# Patient Record
Sex: Male | Born: 1945
Health system: Southern US, Community
[De-identification: ages and names within clinical notes are randomized; demographics above are authoritative.]

## PROBLEM LIST (undated history)

## (undated) DIAGNOSIS — E785 Hyperlipidemia, unspecified: Secondary | ICD-10-CM

## (undated) DIAGNOSIS — M1990 Unspecified osteoarthritis, unspecified site: Secondary | ICD-10-CM

## (undated) DIAGNOSIS — I10 Essential (primary) hypertension: Secondary | ICD-10-CM

## (undated) HISTORY — DX: Hyperlipidemia, unspecified: E78.5

## (undated) HISTORY — DX: Unspecified osteoarthritis, unspecified site: M19.90

## (undated) HISTORY — PX: FRACTURE SURGERY: SHX138

## (undated) HISTORY — PX: BACK SURGERY: SHX140

## (undated) HISTORY — DX: Essential (primary) hypertension: I10

---

## 1997-11-06 ENCOUNTER — Emergency Department (HOSPITAL_COMMUNITY): Admission: EM | Admit: 1997-11-06 | Discharge: 1997-11-06 | Payer: Self-pay | Admitting: Emergency Medicine

## 1999-12-07 ENCOUNTER — Encounter: Payer: Self-pay | Admitting: Internal Medicine

## 1999-12-07 ENCOUNTER — Ambulatory Visit (HOSPITAL_COMMUNITY): Admission: RE | Admit: 1999-12-07 | Discharge: 1999-12-07 | Payer: Self-pay | Admitting: Internal Medicine

## 2000-12-10 ENCOUNTER — Encounter: Payer: Self-pay | Admitting: Neurosurgery

## 2000-12-10 ENCOUNTER — Encounter: Admission: RE | Admit: 2000-12-10 | Discharge: 2000-12-10 | Payer: Self-pay | Admitting: Neurosurgery

## 2000-12-24 ENCOUNTER — Encounter: Admission: RE | Admit: 2000-12-24 | Discharge: 2000-12-24 | Payer: Self-pay | Admitting: Neurosurgery

## 2000-12-24 ENCOUNTER — Encounter: Payer: Self-pay | Admitting: Neurosurgery

## 2001-02-27 ENCOUNTER — Ambulatory Visit (HOSPITAL_COMMUNITY): Admission: RE | Admit: 2001-02-27 | Discharge: 2001-02-28 | Payer: Self-pay | Admitting: Neurosurgery

## 2001-02-27 ENCOUNTER — Encounter: Payer: Self-pay | Admitting: Neurosurgery

## 2002-10-06 ENCOUNTER — Ambulatory Visit (HOSPITAL_BASED_OUTPATIENT_CLINIC_OR_DEPARTMENT_OTHER): Admission: RE | Admit: 2002-10-06 | Discharge: 2002-10-06 | Payer: Self-pay | Admitting: Orthopedic Surgery

## 2002-12-13 ENCOUNTER — Ambulatory Visit (HOSPITAL_BASED_OUTPATIENT_CLINIC_OR_DEPARTMENT_OTHER): Admission: RE | Admit: 2002-12-13 | Discharge: 2002-12-13 | Payer: Self-pay | Admitting: Orthopedic Surgery

## 2004-08-02 ENCOUNTER — Ambulatory Visit: Payer: Self-pay | Admitting: Internal Medicine

## 2004-08-09 ENCOUNTER — Ambulatory Visit: Payer: Self-pay | Admitting: Internal Medicine

## 2005-08-08 ENCOUNTER — Ambulatory Visit: Payer: Self-pay | Admitting: Internal Medicine

## 2005-08-13 ENCOUNTER — Ambulatory Visit: Payer: Self-pay | Admitting: Internal Medicine

## 2006-07-24 ENCOUNTER — Ambulatory Visit: Payer: Self-pay | Admitting: Internal Medicine

## 2006-08-22 ENCOUNTER — Ambulatory Visit: Payer: Self-pay | Admitting: Internal Medicine

## 2006-08-22 LAB — CONVERTED CEMR LAB
Albumin: 4.1 g/dL (ref 3.5–5.2)
Alkaline Phosphatase: 47 units/L (ref 39–117)
BUN: 12 mg/dL (ref 6–23)
Basophils Absolute: 0 10*3/uL (ref 0.0–0.1)
Bilirubin Urine: NEGATIVE
Creatinine, Ser: 1.1 mg/dL (ref 0.4–1.5)
Eosinophils Absolute: 0.2 10*3/uL (ref 0.0–0.6)
GFR calc Af Amer: 88 mL/min
GFR calc non Af Amer: 73 mL/min
HDL: 51.6 mg/dL (ref >39.0)
Hemoglobin, Urine: NEGATIVE
Hemoglobin: 12.9 g/dL — ABNORMAL LOW (ref 13.0–17.0)
LDL Cholesterol: 100 mg/dL — ABNORMAL HIGH (ref 0–99)
Leukocytes, UA: NEGATIVE
MCHC: 34.8 g/dL (ref 30.0–36.0)
MCV: 93.6 fL (ref 78.0–100.0)
Monocytes Absolute: 0.3 10*3/uL (ref 0.2–0.7)
Monocytes Relative: 10 % (ref 3.0–11.0)
Neutro Abs: 1.2 10*3/uL — ABNORMAL LOW (ref 1.4–7.7)
PSA: 0.85 ng/mL (ref 0.10–4.00)
Potassium: 4 meq/L (ref 3.5–5.1)
RDW: 12.7 % (ref 11.5–14.6)
TSH: 0.82 microintl units/mL (ref 0.35–5.50)
Triglycerides: 87 mg/dL (ref 0–149)
VLDL: 17 mg/dL (ref 0–40)
pH: 6 (ref 5.0–8.0)

## 2006-09-04 ENCOUNTER — Ambulatory Visit: Payer: Self-pay | Admitting: Internal Medicine

## 2006-09-26 ENCOUNTER — Ambulatory Visit (HOSPITAL_BASED_OUTPATIENT_CLINIC_OR_DEPARTMENT_OTHER): Admission: RE | Admit: 2006-09-26 | Discharge: 2006-09-26 | Payer: Self-pay | Admitting: Orthopedic Surgery

## 2007-01-16 ENCOUNTER — Ambulatory Visit (HOSPITAL_BASED_OUTPATIENT_CLINIC_OR_DEPARTMENT_OTHER): Admission: RE | Admit: 2007-01-16 | Discharge: 2007-01-16 | Payer: Self-pay | Admitting: Internal Medicine

## 2007-01-28 ENCOUNTER — Ambulatory Visit: Payer: Self-pay | Admitting: Pulmonary Disease

## 2007-03-18 ENCOUNTER — Ambulatory Visit: Payer: Self-pay | Admitting: Internal Medicine

## 2007-03-18 ENCOUNTER — Encounter: Payer: Self-pay | Admitting: Internal Medicine

## 2007-03-19 DIAGNOSIS — G4733 Obstructive sleep apnea (adult) (pediatric): Secondary | ICD-10-CM

## 2007-07-22 ENCOUNTER — Telehealth: Payer: Self-pay | Admitting: Internal Medicine

## 2007-07-23 ENCOUNTER — Telehealth: Payer: Self-pay | Admitting: Internal Medicine

## 2007-07-31 ENCOUNTER — Encounter: Payer: Self-pay | Admitting: Internal Medicine

## 2007-07-31 DIAGNOSIS — E785 Hyperlipidemia, unspecified: Secondary | ICD-10-CM | POA: Insufficient documentation

## 2007-07-31 DIAGNOSIS — Z87442 Personal history of urinary calculi: Secondary | ICD-10-CM

## 2007-07-31 DIAGNOSIS — R209 Unspecified disturbances of skin sensation: Secondary | ICD-10-CM | POA: Insufficient documentation

## 2007-08-04 ENCOUNTER — Ambulatory Visit: Payer: Self-pay | Admitting: Pulmonary Disease

## 2007-08-06 ENCOUNTER — Encounter: Payer: Self-pay | Admitting: Pulmonary Disease

## 2007-08-06 ENCOUNTER — Ambulatory Visit (HOSPITAL_BASED_OUTPATIENT_CLINIC_OR_DEPARTMENT_OTHER): Admission: RE | Admit: 2007-08-06 | Discharge: 2007-08-06 | Payer: Self-pay | Admitting: Pulmonary Disease

## 2007-08-12 ENCOUNTER — Ambulatory Visit: Payer: Self-pay | Admitting: Pulmonary Disease

## 2007-08-17 ENCOUNTER — Encounter: Payer: Self-pay | Admitting: Pulmonary Disease

## 2007-08-31 ENCOUNTER — Ambulatory Visit: Payer: Self-pay | Admitting: Pulmonary Disease

## 2007-09-18 ENCOUNTER — Ambulatory Visit: Payer: Self-pay | Admitting: Internal Medicine

## 2007-09-20 LAB — CONVERTED CEMR LAB
Albumin: 4.2 g/dL (ref 3.5–5.2)
Alkaline Phosphatase: 39 units/L (ref 39–117)
BUN: 13 mg/dL (ref 6–23)
Bilirubin Urine: NEGATIVE
Calcium: 9.6 mg/dL (ref 8.4–10.5)
Cholesterol: 157 mg/dL (ref 0–200)
Eosinophils Absolute: 0.1 10*3/uL (ref 0.0–0.7)
Eosinophils Relative: 3.7 % (ref 0.0–5.0)
GFR calc Af Amer: 61 mL/min
GFR calc non Af Amer: 51 mL/min
Glucose, Bld: 79 mg/dL (ref 70–99)
HCT: 40.3 % (ref 39.0–52.0)
HDL: 51.7 mg/dL (ref >39.0)
Hemoglobin, Urine: NEGATIVE
Hemoglobin: 13.6 g/dL (ref 13.0–17.0)
MCV: 97.6 fL (ref 78.0–100.0)
Monocytes Absolute: 0.3 10*3/uL (ref 0.1–1.0)
Monocytes Relative: 11.2 % (ref 3.0–12.0)
Neutro Abs: 1.5 10*3/uL (ref 1.4–7.7)
PSA: 0.62 ng/mL (ref 0.10–4.00)
Platelets: 139 10*3/uL — ABNORMAL LOW (ref 150–400)
Potassium: 4.2 meq/L (ref 3.5–5.1)
RDW: 12.5 % (ref 11.5–14.6)
TSH: 0.78 microintl units/mL (ref 0.35–5.50)
Total Protein, Urine: NEGATIVE mg/dL
Total Protein: 6.7 g/dL (ref 6.0–8.3)
Triglycerides: 93 mg/dL (ref 0–149)
Urine Glucose: NEGATIVE mg/dL
VLDL: 19 mg/dL (ref 0–40)
WBC: 3.1 10*3/uL — ABNORMAL LOW (ref 4.5–10.5)
pH: 6 (ref 5.0–8.0)

## 2007-09-25 ENCOUNTER — Ambulatory Visit: Payer: Self-pay | Admitting: Internal Medicine

## 2007-09-25 DIAGNOSIS — M159 Polyosteoarthritis, unspecified: Secondary | ICD-10-CM | POA: Insufficient documentation

## 2007-12-23 ENCOUNTER — Ambulatory Visit: Payer: Self-pay | Admitting: Internal Medicine

## 2007-12-23 ENCOUNTER — Telehealth: Payer: Self-pay | Admitting: Internal Medicine

## 2007-12-23 ENCOUNTER — Ambulatory Visit: Payer: Self-pay | Admitting: Cardiology

## 2007-12-23 DIAGNOSIS — R519 Headache, unspecified: Secondary | ICD-10-CM | POA: Insufficient documentation

## 2007-12-23 DIAGNOSIS — R51 Headache: Secondary | ICD-10-CM

## 2007-12-24 ENCOUNTER — Telehealth: Payer: Self-pay | Admitting: Internal Medicine

## 2008-03-07 ENCOUNTER — Ambulatory Visit: Payer: Self-pay | Admitting: Pulmonary Disease

## 2008-04-27 ENCOUNTER — Encounter: Payer: Self-pay | Admitting: Internal Medicine

## 2008-08-02 ENCOUNTER — Ambulatory Visit: Payer: Self-pay | Admitting: Internal Medicine

## 2008-08-02 LAB — CONVERTED CEMR LAB
Albumin: 4.1 g/dL (ref 3.5–5.2)
Alkaline Phosphatase: 42 units/L (ref 39–117)
Basophils Absolute: 0 10*3/uL (ref 0.0–0.1)
Chloride: 105 meq/L (ref 96–112)
Cholesterol: 182 mg/dL (ref 0–200)
Direct LDL: 89.3 mg/dL
Eosinophils Absolute: 0.1 10*3/uL (ref 0.0–0.7)
GFR calc non Af Amer: 79.16 mL/min (ref >60)
HDL: 53.9 mg/dL (ref >39.00)
Hemoglobin, Urine: NEGATIVE
Hemoglobin: 14.2 g/dL (ref 13.0–17.0)
Lymphocytes Relative: 38.9 % (ref 12.0–46.0)
MCHC: 34 g/dL (ref 30.0–36.0)
Monocytes Relative: 9.7 % (ref 3.0–12.0)
Neutro Abs: 1.7 10*3/uL (ref 1.4–7.7)
Neutrophils Relative %: 46.8 % (ref 43.0–77.0)
Nitrite: NEGATIVE
Platelets: 136 10*3/uL — ABNORMAL LOW (ref 150.0–400.0)
RDW: 12 % (ref 11.5–14.6)
Sodium: 142 meq/L (ref 135–145)
Specific Gravity, Urine: 1.025 (ref 1.000–1.030)
Total Bilirubin: 0.9 mg/dL (ref 0.3–1.2)
Total CHOL/HDL Ratio: 3
Total Protein, Urine: NEGATIVE mg/dL
Triglycerides: 239 mg/dL — ABNORMAL HIGH (ref 0.0–149.0)
Urobilinogen, UA: 0.2 (ref 0.0–1.0)
VLDL: 47.8 mg/dL — ABNORMAL HIGH (ref 0.0–40.0)

## 2008-08-04 ENCOUNTER — Encounter (INDEPENDENT_AMBULATORY_CARE_PROVIDER_SITE_OTHER): Payer: Self-pay | Admitting: *Deleted

## 2008-08-09 ENCOUNTER — Ambulatory Visit: Payer: Self-pay | Admitting: Internal Medicine

## 2008-08-09 DIAGNOSIS — M23302 Other meniscus derangements, unspecified lateral meniscus, unspecified knee: Secondary | ICD-10-CM

## 2008-08-09 DIAGNOSIS — F528 Other sexual dysfunction not due to a substance or known physiological condition: Secondary | ICD-10-CM

## 2008-08-09 DIAGNOSIS — K649 Unspecified hemorrhoids: Secondary | ICD-10-CM | POA: Insufficient documentation

## 2008-08-09 DIAGNOSIS — S82899A Other fracture of unspecified lower leg, initial encounter for closed fracture: Secondary | ICD-10-CM | POA: Insufficient documentation

## 2008-08-09 LAB — CONVERTED CEMR LAB: Testosterone: 349.24 ng/dL — ABNORMAL LOW (ref 350.00–890.00)

## 2009-03-13 ENCOUNTER — Ambulatory Visit: Payer: Self-pay | Admitting: Internal Medicine

## 2009-08-11 ENCOUNTER — Ambulatory Visit: Payer: Self-pay | Admitting: Internal Medicine

## 2009-08-11 LAB — CONVERTED CEMR LAB
Albumin: 4.3 g/dL (ref 3.5–5.2)
Alkaline Phosphatase: 48 units/L (ref 39–117)
BUN: 11 mg/dL (ref 6–23)
Basophils Relative: 0.2 % (ref 0.0–3.0)
Bilirubin Urine: NEGATIVE
Cholesterol: 147 mg/dL (ref 0–200)
Creatinine, Ser: 1.3 mg/dL (ref 0.4–1.5)
Eosinophils Absolute: 0.1 10*3/uL (ref 0.0–0.7)
GFR calc non Af Amer: 71.93 mL/min (ref >60)
HCT: 41.8 % (ref 39.0–52.0)
Hemoglobin, Urine: NEGATIVE
Hemoglobin: 13.9 g/dL (ref 13.0–17.0)
Lymphs Abs: 1.2 10*3/uL (ref 0.7–4.0)
MCHC: 33.3 g/dL (ref 30.0–36.0)
MCV: 97.2 fL (ref 78.0–100.0)
Monocytes Absolute: 0.6 10*3/uL (ref 0.1–1.0)
Neutro Abs: 1.6 10*3/uL (ref 1.4–7.7)
Nitrite: NEGATIVE
PSA: 2.81 ng/mL (ref 0.10–4.00)
RBC: 4.31 M/uL (ref 4.22–5.81)
TSH: 1.08 microintl units/mL (ref 0.35–5.50)
Total Protein, Urine: NEGATIVE mg/dL
Total Protein: 7.5 g/dL (ref 6.0–8.3)

## 2009-08-18 ENCOUNTER — Ambulatory Visit: Payer: Self-pay | Admitting: Internal Medicine

## 2009-08-18 DIAGNOSIS — M2240 Chondromalacia patellae, unspecified knee: Secondary | ICD-10-CM | POA: Insufficient documentation

## 2009-08-20 ENCOUNTER — Telehealth: Payer: Self-pay | Admitting: Internal Medicine

## 2009-08-21 ENCOUNTER — Ambulatory Visit: Payer: Self-pay | Admitting: Internal Medicine

## 2009-08-21 ENCOUNTER — Telehealth: Payer: Self-pay | Admitting: Internal Medicine

## 2009-09-03 ENCOUNTER — Telehealth: Payer: Self-pay | Admitting: Internal Medicine

## 2010-06-19 NOTE — Progress Notes (Signed)
  Phone Note Outgoing Call   Summary of Call: Due to night sweats please call the patient to return for PA & lateral chest x-ray 780.8  Thanks Initial call taken by: Jacques Navy MD,  August 20, 2009 7:12 PM  Follow-up for Phone Call        lmoam for pt to call back Follow-up by: Ami Bullins CMA,  August 21, 2009 10:20 AM  Additional Follow-up for Phone Call Additional follow up Details #1::        pt to come in this afternoon. I have put in idx Additional Follow-up by: Ami Bullins CMA,  August 21, 2009 1:15 PM

## 2010-06-19 NOTE — Progress Notes (Signed)
  Phone Note Outgoing Call   Reason for Call: Discuss lab or test results Summary of Call: Please call patient: chest xray was normal with no enlaged lymph nodes. If he continues to hve night sweats will need a follow-up appointment Initial call taken by: Jacques Navy MD,  September 03, 2009 10:16 PM  Follow-up for Phone Call        lmoam for pt to call back Follow-up by: Ami Bullins CMA,  September 05, 2009 2:08 PM

## 2010-06-19 NOTE — Assessment & Plan Note (Signed)
Summary: CPX / NWS #   Vital Signs:  Patient profile:   65 year old male Height:      68 inches (172.72 cm) Weight:      189.50 pounds (86.14 kg) O2 Sat:      96 % on Room air Temp:     97.6 degrees F (36.44 degrees C) oral Pulse rate:   85 / minute BP sitting:   150 / 88  (left arm) Cuff size:   regular  Vitals Entered By: Bill Salinas CMA (August 18, 2009 2:46 PM)/ Sydell Axon, SMA  O2 Flow:  Room air CC: Pt here for CPX, per pt, last flu vax 02/17/2009, colonoscopy 09/12/2003, and last eye exam 09/2008 (normal). //Elsie   Primary Care Provider:  [facility]  CC:  Pt here for CPX, per pt, last flu vax 02/17/2009, colonoscopy 09/12/2003, and and last eye exam 09/2008 (normal). //Aneta.  History of Present Illness: Patient presents for annual medical followup.  He is having increasing pain and paresthesia left hip and proximal LE. He has constant pain and low back pain routinely worse with walking.  With standing he will have a dense numbness. He doesn't have much pain at night. He does have pain with movement of the right knee at night. He had been on Naproxen/esomeprazole (Vimovo) for a swollen knee that developed after he had a "pop" in the knee. He did receive a steroid injection which did not help  Having post-traumatic arthritic pain right wrist.   He is having night sweats, drenching night clothes. Previously intermittent  but for the last 2-3 months nightly. No weight loss. No enlarged lymph glands. He did have an exacerbation over the past two weeks during which time he had diarrhea-watery stools without blood. His stools are becoming more formed.   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.9) DYSPHAGIA UNSPECIFIED (ICD-787.20) NEPHROLITHIASIS, HX OF (ICD-V13.01) OBSTRUCTIVE SLEEP APNEA (ICD-327.23)  Past Surgical History: Last updated: 08/09/2008 Bone Graft from Left Hip for wrist repair Repair of Right wrist in 1987 Hemilaminectomy L5-S1 October 2002 by Ashley Akin Matrixectomy by podiatry in 2002 Carpal tunnel release May '08 ([facility]) ORIF left ankle -( [facility])  Family History: Last updated: 08/09/2008 father - 1921: health is good mother - 71: health is good brother 1943-deceased: lung disease related to smoking Neg-colon cancer; DM; CAD/MI M Uncle - prostate cancer  Social History: Last updated: 08/09/2008 HSG,  Service - Scientist, research (life sciences); 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[place]; Insurance account manager Married '81- 3.5 yrs/divorced; Married '00 1 daughter - '74; 3 grandchildren ('96, '04, '10) Midwife - courthouse married to nurse  Risk Factors: Exercise: yes (09/25/2007)  Risk Factors: Smoking Status: never (03/13/2009)  Review of Systems  The patient denies anorexia, fever, weight loss, weight gain, vision loss, hoarseness, chest pain, syncope, dyspnea on exertion, prolonged cough, headaches, hemoptysis, abdominal pain, hematochezia, severe indigestion/heartburn, incontinence, muscle weakness, transient blindness, depression, abnormal bleeding, and angioedema.    Physical Exam  General:  WNWD atheletic appearing AA male in no distress Head:  Normocephalic and atraumatic without obvious abnormalities. No apparent alopecia or balding. Eyes:  vision  grossly intact, pupils equal, pupils round, corneas and lenses clear, no injection, and no optic disk abnormalities.   Ears:  External ear  exam shows no significant lesions or deformities.  Otoscopic examination reveals clear canals, tympanic membranes are intact bilaterally without bulging, retraction, inflammation or discharge. Hearing is grossly normal bilaterally. Nose:  no external deformity and no external erythema.   Mouth:  Oral mucosa and oropharynx without lesions or exudates.  Teeth in good repair. Neck:  supple, full ROM, no thyromegaly, and no carotid bruits.   Chest Wall:  no deformities.   Lungs:  Normal respiratory effort, chest expands symmetrically. Lungs are clear to auscultation, no crackles or wheezes. Heart:  Normal rate and regular rhythm. S1 and S2 normal without gallop, murmur, click, rub or other extra sounds. Abdomen:  soft, non-tender, normal bowel sounds, no masses, no guarding, no hepatomegaly, and no splenomegaly.   Rectal:  No external abnormalities noted. Normal sphincter tone. No rectal masses or tenderness. Prostate:  Prostate gland firm and smooth, no enlargement, nodularity, tenderness, mass, asymmetry or induration. Msk:  marked crepitis right knee. Negative drawer sign. No medial or lateral instability. Decreased crepitis left knee. Tenderness with external rotation of the  left hip. Tender to pressure against the greater trochanter. Pulses:  2+ radial and DP pulses Extremities:  No clubbing, cyanosis, edema, or deformity noted with normal full range of motion of all joints.   Neurologic:  mild decreased sensation dorsal aspect proximal left LE. DTR's 2+ and equal.Nl SLR sitting position.alert & oriented X3, cranial nerves II-XII intact, strength normal in all extremities, and gait normal.   Skin:  turgor normal, color normal, no rashes, no suspicious lesions, no petechiae, no ulcerations, and no edema.   Cervical Nodes:  no anterior cervical adenopathy and no posterior cervical adenopathy.   Inguinal Nodes:  no R inguinal adenopathy and no L inguinal adenopathy.   Psych:  Oriented X3, memory  intact for recent and remote, normally interactive, and good eye contact.     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 Notable crepitis at the patella. Symptoms and exam consistent with chondromalacia right knee  Plan - knee films           NSAIDs           for increasing pain or limitation ortho consult.  His updated medication list for this problem includes:    Vimovo 500-20 Mg Tbec (Naproxen-esomeprazole) ..... One by mouth two times a day with food as needed for knee pain  DG KNEE 2 VIEWS*R* - [id number]   Clinical Data: Pain   RIGHT KNEE - 1-2 VIEW   Comparison: 03/13/2009   Findings: There is a moderate sized joint effusion.  There are changes of chondromalacia at the patellofemoral joint. Weightbearing compartments appear normal.  No fracture or other focal lesion.   IMPRESSION: Joint effusion.  Changes of chondromalacia at the patellofemoral joint.   Read By:  Thomasenia Sales,  M.D.  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  Plan - adjustment of belt if possible.  Problem # 3:  GEN OSTEOARTHROSIS INVOLVING MULTIPLE SITES (ICD-715.09) Right wrist pain is most likely post-traumatic arthritis. 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  Plan - bilateral hip films  otc NSAIDs - GI precautions given.  His updated medication list for this problem includes:    Vimovo 500-20 Mg Tbec (Naproxen-esomeprazole) ..... One by mouth two times a day with food as needed for knee pain  Orders: T-Bilateral Hip w/Pelvis, min 2 views (73520TC) G HIP BILATERAL W/PELVIS - [id number]   Clinical Data: Left hip pain   BILATERAL HIP WITH PELVIS - 4+ VIEW   Comparison: None.   Findings: Frontal view of  the pelvis shows no evidence for fracture.  The joint space in the hips as well preserved and symmetric.  There is some minimal degenerative spurring in each acetabulum.   Prominent bony protuberance is seen in the region of the left anterior inferior iliac spine.  This has two adjacent tiny bone fragments and there is associated irregularity of the superior iliac spine.  These changes are probably related to previous avulsion injury related to the rectus femoris or sartorius.  Less likely considerations would include osteochondroma of the left iliac bone.   SI joints and symphysis pubis are unremarkable.  AP and frog-leg lateral views of the left hip show no evidence for fracture.  AP and frog-leg lateral views of the right hip are also without evidence of fracture.   IMPRESSION: No acute bony findings.   Bony protuberance from the anterior left iliac bone is probably chronic post traumatic deformity from prior avulsion injury in the region of the anterior iliac spines.  This could be related to osteochondroma although given the adjacent tiny fragments, previous trauma is felt to be more likely. If the patient's symptoms persist or worsen and MRI of the left hip is ultimately performed, inclusion of the entire pelvis as part of that exam would be helpful to further assess the anterior iliac finding at that time.  For continued pain - may need MRI if pain continues.  Problem # 4:  ERECTILE DYSFUNCTION (ICD-302.72) Renewed Rx  His updated medication list for this problem includes:    Viagra 100 Mg Tabs (Sildenafil citrate) .Marland Kitchen... Take as needed.  Problem # 5:  HYPERLIPIDEMIA (ICD-272.4)  His updated medication list for this problem includes:    Lovastatin 40 Mg Tabs (Lovastatin) .Marland Kitchen... Take 1 tablet by mouth once a day  Labs Reviewed: SGOT: 49 (08/11/2009)   SGPT: 59 (08/11/2009)   HDL:53.80 (08/11/2009), 53.90 (08/02/2008)  LDL:80 (08/11/2009), 87 (09/18/2007)  Chol:147  (08/11/2009), 182 (08/02/2008)  Trig:66.0 (08/11/2009), 239.0 (08/02/2008)  Good control on present medication  Problem # 6:  NIGHT SWEATS (ICD-780.8) Exam is unremarkable for any enlarged lymph nodes or other abnormality to explain night sweats.   Plan - return for Chest X-ray.           If chest x-ray is normal will need repeat exam and additional lab.  Problem # 7:  Preventive Health Care (ICD-V70.0) Exam is unremarkable except for ortho findings detailed above. Lab results are fine. No colonoscopy report in EMR - may need routine screening. Needs pneumonia vaccine and shingles vaccine.  In summary - a very nice man with some progressive arthritic problems and unexplained night sweats. He will be brought back for chest x-ray and continued evaluation.  Complete Medication List: 1)  Lovastatin 40 Mg Tabs (Lovastatin) .... Take 1 tablet by mouth once a day 2)  Cpap +10, Pillows, Humidifier  .... As directed 3)  Viagra 100 Mg Tabs (Sildenafil citrate) .... Take as needed. 4)  Hemorrhoidal-hc 25 Mg Supp (Hydrocortisone acetate) .Marland Kitchen.. 1 pr two times a day as  needed 5)  Vimovo 500-20 Mg Tbec (Naproxen-esomeprazole) .... One by mouth two times a day with food as needed for knee pain  Other Orders: T-Knee Right 2 view (73560TC)   Patient: Lenard Desai Note: All result statuses are Final unless otherwise noted.  Tests: (1) BMP (METABOL)   Sodium                    144 mEq/L                   135-145   Potassium                 4.2 mEq/L                   3.5-5.1   Chloride                  104 mEq/L                   96-112   Carbon Dioxide            30 mEq/L                    19-32   Glucose                   78 mg/dL                    65-78   BUN                       11 mg/dL                    4-69   Creatinine                1.3 mg/dL                   6.2-9.5   Calcium                   9.2 mg/dL                   2.8-41.3   GFR                       71.93 mL/min                 >60  Tests: (2) Lipid Panel (LIPID)   Cholesterol               147 mg/dL                   2-440     ATP III Classification            Desirable:  < 200 mg/dL                    Borderline High:  200 - 239 mg/dL               High:  > = 240 mg/dL   Triglycerides             66.0 mg/dL                  1.0-272.5     Normal:  <150 mg/dL     Borderline High:  366 - 199 mg/dL   HDL  53.80 mg/dL                 >16.10   VLDL Cholesterol          13.2 mg/dL                  9.6-04.5   LDL Cholesterol           80 mg/dL                    4-09  CHO/HDL Ratio:  CHD Risk                             3                    Men          Women     1/2 Average Risk     3.4          3.3     Average Risk          5.0          4.4     2X Average Risk          9.6          7.1     3X Average Risk          15.0          11.0                           Tests: (3) CBC Platelet w/Diff (CBCD)   White Cell Count     [L]  3.5 K/uL                    4.5-10.5   Red Cell Count            4.31 Mil/uL                 4.22-5.81   Hemoglobin                13.9 g/dL                   81.1-91.4   Hematocrit                41.8 %                      39.0-52.0   MCV                       97.2 fl                     78.0-100.0   MCHC                      33.3 g/dL                   78.2-95.6   RDW                       12.4 %                      11.5-14.6   Platelet Count       [L]  130.0 K/uL  150.0-400.0   Neutrophil %              46.1 %                      43.0-77.0   Lymphocyte %              33.8 %                      12.0-46.0   Monocyte %           [H]  16.3 %                      3.0-12.0   Eosinophils%              3.6 %                       0.0-5.0   Basophils %               0.2 %                       0.0-3.0   Neutrophill Absolute      1.6 K/uL                    1.4-7.7   Lymphocyte Absolute       1.2 K/uL                    0.7-4.0   Monocyte Absolute          0.6 K/uL                    0.1-1.0  Eosinophils, Absolute                             0.1 K/uL                    0.0-0.7   Basophils Absolute        0.0 K/uL                    0.0-0.1  Tests: (4) Hepatic/Liver Function Panel (HEPATIC)   Total Bilirubin           0.8 mg/dL                   9.8-1.1   Direct Bilirubin          0.2 mg/dL                   9.1-4.7   Alkaline Phosphatase      48 U/L                      39-117   AST                  [H]  49 U/L                      0-37   ALT                  [H]  59 U/L                      0-53   Total Protein  7.5 g/dL                    9.5-6.2   Albumin                   4.3 g/dL                    1.3-0.8  Tests: (5) TSH (TSH)   FastTSH                   1.08 uIU/mL                 0.35-5.50  Tests: (6) Prostate Specific Antigen (PSA)   PSA-Hyb                   2.81 ng/mL                  0.10-4.00  Tests: (7) UDip Only (UDIP)   Color                     LT. YELLOW       RANGE:  Yellow;Lt. Yellow   Clarity                   CLEAR                       Clear   Specific Gravity          1.010                       1.000 - 1.030   Urine Ph                  5.0                         5.0-8.0   Protein                   NEGATIVE                    Negative   Urine Glucose             NEGATIVE                    Negative   Ketones                   NEGATIVE                    Negative   Urine Bilirubin           NEGATIVE                    Negative   Blood                     NEGATIVE                    Negative   Urobilinogen              0.2                         0.0 - 1.0   Leukocyte Esterace        NEGATIVE  Negative   Nitrite                   NEGATIVE                    Negative Prescriptions: VIAGRA 100 MG TABS (SILDENAFIL CITRATE) take as needed.  #6 x 12   Entered and Authorized by:   Jacques Navy MD   Signed by:   Jacques Navy MD on 08/18/2009   Method used:   Electronically  to        [facility]  [address] [id number] (retail)       [address]       [place], [place]  [address]       Ph: [phone] or [phone]       Fax: [phone]   RxID:   [id number] LOVASTATIN 40 MG TABS (LOVASTATIN) Take 1 tablet by mouth once a day  #30 Tablet x 12   Entered and Authorized by:   Jacques Navy MD   Signed by:   Jacques Navy MD on 08/18/2009   Method used:   Electronically to        [facility]  [address] [id number] ([facility]ail)       [address]       [place], [place]  [address]       Ph: [phone] or [phone]       Fax: [phone]   RxID:   [id number]

## 2010-07-26 ENCOUNTER — Other Ambulatory Visit: Payer: 59

## 2010-07-26 ENCOUNTER — Encounter (INDEPENDENT_AMBULATORY_CARE_PROVIDER_SITE_OTHER): Payer: Self-pay | Admitting: *Deleted

## 2010-07-26 ENCOUNTER — Other Ambulatory Visit: Payer: Self-pay | Admitting: Internal Medicine

## 2010-07-26 DIAGNOSIS — Z0000 Encounter for general adult medical examination without abnormal findings: Secondary | ICD-10-CM

## 2010-07-26 DIAGNOSIS — Z0389 Encounter for observation for other suspected diseases and conditions ruled out: Secondary | ICD-10-CM

## 2010-07-26 LAB — URINALYSIS, ROUTINE W REFLEX MICROSCOPIC
Hgb urine dipstick: NEGATIVE
Ketones, ur: NEGATIVE
Specific Gravity, Urine: 1.02 (ref 1.000–1.030)
Urine Glucose: NEGATIVE
Urobilinogen, UA: 0.2 (ref 0.0–1.0)

## 2010-07-26 LAB — LIPID PANEL
Cholesterol: 191 mg/dL (ref 0–200)
HDL: 66.5 mg/dL (ref >39.00)
LDL Cholesterol: 100 mg/dL — ABNORMAL HIGH (ref 0–99)
Triglycerides: 121 mg/dL (ref 0.0–149.0)
VLDL: 24.2 mg/dL (ref 0.0–40.0)

## 2010-07-26 LAB — BASIC METABOLIC PANEL
BUN: 13 mg/dL (ref 6–23)
Calcium: 9.7 mg/dL (ref 8.4–10.5)
Creatinine, Ser: 1.2 mg/dL (ref 0.4–1.5)
GFR: 77.16 mL/min (ref >60.00)
Glucose, Bld: 81 mg/dL (ref 70–99)
Potassium: 4.3 mEq/L (ref 3.5–5.1)

## 2010-07-26 LAB — CBC WITH DIFFERENTIAL/PLATELET
Basophils Absolute: 0 10*3/uL (ref 0.0–0.1)
Eosinophils Relative: 2.5 % (ref 0.0–5.0)
HCT: 42.5 % (ref 39.0–52.0)
Lymphocytes Relative: 27.9 % (ref 12.0–46.0)
Lymphs Abs: 1.4 10*3/uL (ref 0.7–4.0)
Monocytes Relative: 10.1 % (ref 3.0–12.0)
Neutrophils Relative %: 59.2 % (ref 43.0–77.0)
Platelets: 139 10*3/uL — ABNORMAL LOW (ref 150.0–400.0)
RDW: 12.8 % (ref 11.5–14.6)
WBC: 5.1 10*3/uL (ref 4.5–10.5)

## 2010-07-26 LAB — HEPATIC FUNCTION PANEL
AST: 21 U/L (ref 0–37)
Albumin: 4.6 g/dL (ref 3.5–5.2)
Total Bilirubin: 1 mg/dL (ref 0.3–1.2)

## 2010-07-26 LAB — TSH: TSH: 0.95 u[IU]/mL (ref 0.35–5.50)

## 2010-07-26 LAB — PSA: PSA: 1.07 ng/mL (ref 0.10–4.00)

## 2010-08-02 ENCOUNTER — Encounter: Payer: Self-pay | Admitting: Internal Medicine

## 2010-08-02 ENCOUNTER — Encounter (INDEPENDENT_AMBULATORY_CARE_PROVIDER_SITE_OTHER): Payer: 59 | Admitting: Internal Medicine

## 2010-08-02 DIAGNOSIS — Z0000 Encounter for general adult medical examination without abnormal findings: Secondary | ICD-10-CM

## 2010-08-02 DIAGNOSIS — M5416 Radiculopathy, lumbar region: Secondary | ICD-10-CM | POA: Insufficient documentation

## 2010-08-02 DIAGNOSIS — G479 Sleep disorder, unspecified: Secondary | ICD-10-CM

## 2010-08-02 DIAGNOSIS — IMO0002 Reserved for concepts with insufficient information to code with codable children: Secondary | ICD-10-CM

## 2010-08-02 DIAGNOSIS — Z136 Encounter for screening for cardiovascular disorders: Secondary | ICD-10-CM

## 2010-08-03 ENCOUNTER — Telehealth: Payer: Self-pay | Admitting: Internal Medicine

## 2010-08-07 NOTE — Assessment & Plan Note (Signed)
Summary: cpx-lb   Vital Signs:  Patient profile:   65 year old male Height:      68 inches Weight:      190 pounds BMI:     28.99 O2 Sat:      97 % on Room air Temp:     98.7 degrees F oral Pulse rate:   67 / minute BP sitting:   140 / 98  (left arm) Cuff size:   regular  Vitals Entered By: Bill Salinas CMA (August 02, 2010 1:30 PM)  O2 Flow:  Room air CC: cpx/ ab  Vision Screening:      Vision Comments: Eye Exam due   Primary Care Provider:  Ranard Harte  CC:  cpx/ ab.  History of Present Illness: Russell Drake presents for annual exam.   He is having some low back pain at the site of previous operation on lumbar spine. He does have radiation to the left leg which is better than before surgery. However, he does have difficulty with ambulation, paresthesia in the left foot in addition to pain.   Sleep duration  problems: about 2-3 hours then awake. He does have bad dreams that is a long lasting problem since [place]. About two years ago he did go to the [place] and has been in monthly counseling for PTSD. He does stay in bed while not sleeping.   Preventive Screening-Counseling & Management  Alcohol-Tobacco     Alcohol drinks/day: <1     Smoking Status: never  Caffeine-Diet-Exercise     Caffeine use/day: 2 cups per day     Does Patient Exercise: yes     Type of exercise: cardio, weight lifting     Times/week: 4  Hep-HIV-STD-Contraception     Dental Visit-last 6 months yes     Sun Exposure-Excessive: no  Safety-Violence-Falls     Seat Belt Use: yes     Helmet Use: n/a     Firearms in the Home: firearms in the home     Smoke Detectors: yes     Violence in the Home: no risk noted     Sexual Abuse: no     Fall Risk: low fall risk  Current Medications (verified): 1)  Lovastatin 40 Mg Tabs (Lovastatin) .... Take 1 Tablet By Mouth Once A Day 2)  Cpap +10, Pillows, Humidifier .... As Directed 3)  Viagra 100 Mg Tabs (Sildenafil Citrate) .... Take As Needed. 4)   Hemorrhoidal-Hc 25 Mg Supp (Hydrocortisone Acetate) .Marland Kitchen.. 1 Pr Two Times A Day As Needed 5)  Vimovo 500-20 Mg Tbec (Naproxen-Esomeprazole) .... 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) FRACTURE, ANKLE, LEFT (ICD-824.8) DERANGEMENT OF MENISCUS NOT ELSEWHERE CLASSIFIED (ICD-717.5) HEMORRHOIDS (ICD-455.6) ERECTILE DYSFUNCTION (ICD-302.72) HEADACHE (ICD-784.0) ROUTINE GENERAL MEDICAL EXAM@HEALTH  CARE FACL (ICD-V70.0) GEN OSTEOARTHROSIS INVOLVING MULTIPLE SITES (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) PARESTHESIA, HANDS (ICD-782.0) HYPERLIPIDEMIA (ICD-272.4) NEPHROLITHIASIS, HX OF (ICD-V13.01) OBSTRUCTIVE SLEEP APNEA (ICD-327.23)  Family History: father - 57: health is good- recently admitted to SNF (March '12) mother - 1928: health is good brother 1943-deceased: lung disease related to smoking Neg-colon cancer;  DM; CAD/MI M Uncle - prostate cancer  Social History: HS[employer],  Service Pharmacologist; 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[place]; Insurance account manager Married '81- 3.5 yrs/divorced; Married '00 1 daughter - '74; 3 grandchildren ('96, '04, '10) 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    Physical Exam  General:  WNWD athletic appearing AA male in no obvious distress Head:  normocephalic, atraumatic, and no abnormalities observed.   Eyes:  vision grossly intact, pupils equal, pupils round, pupils reactive to light, corneas and lenses clear, and no injection.   Ears:  External ear exam shows no significant lesions or deformities.  Otoscopic examination reveals clear canals, tympanic membranes are intact bilaterally without bulging, retraction, inflammation or discharge. Hearing is grossly normal bilaterally. Nose:  no external deformity and no external erythema.   Mouth:  Oral mucosa and oropharynx without lesions or exudates.  Teeth in good repair. Neck:  supple, full ROM, no thyromegaly, no JVD, and no carotid bruits.   Chest Wall:  No deformities, masses, tenderness or gynecomastia noted. Lungs:  Normal respiratory effort, chest expands symmetrically. Lungs are clear to auscultation, no crackles or wheezes. Heart:  Normal rate and regular rhythm. S1 and S2 normal without gallop, murmur, click, rub or other extra sounds. Abdomen:  soft, non-tender, normal bowel sounds, no guarding, and no hepatomegaly.   Prostate:  deferred to normal exam Msk:  back exam - difficulty rising from seated position; able to flex to 30degrees before limited by pain; atalgic gait favoring the left leg; pain and difficulty with both toe and heel walk; unable to step up to exam using the left leg; SLR sitting caused pain in left lower back with both legs; Patellar tendon reflex diminished on the left. Pulses:  2+ radial and DP pulse Extremities:  No clubbing, cyanosis, edema, or deformity noted with normal full range of motion of all joints.   Neurologic:  alert & oriented X3 and cranial nerves  II-XII intact.   Skin:  turgor normal, color normal, no suspicious lesions, no petechiae, and no ulcerations.   Cervical Nodes:  No lymphadenopathy noted Inguinal Nodes:  No significant adenopathy Psych:  Oriented X3, memory intact for recent and remote, normally interactive, good eye contact, and not anxious appearing.     Impression & Recommendations:  Problem # 1:  LUMBAR RADICULOPATHY, LEFT (ICD-724.4) Patient with significant radiculopathy left leg suggestive L4-S2 lesion. He has had prior back surgery- hemilaminectomy L5-S1.  Plan - urgent referral to Dr. Franky Macho           steroid burst and taper: prednisone 30 x 3, 20 x 3, 10 x6  The following medications were removed from the medication list:    Vimovo 500-20 Mg Tbec (Naproxen-esomeprazole) ..... One by mouth two times a day with food as needed for knee pain  Orders: Neurosurgeon Referral (Neurosurgeon)  Problem # 2:  ERECTILE DYSFUNCTION (ICD-302.72) Continues to get good results with viagra  His updated medication list for this problem includes:    Viagra 100  Mg Tabs (Sildenafil citrate) .Marland Kitchen... Take as needed.  Problem # 3:  HYPERLIPIDEMIA (ICD-272.4)  His updated medication list for this problem includes:    Lovastatin 40 Mg Tabs (Lovastatin) .Marland Kitchen... Take 1 tablet by mouth once a day  Labs Reviewed: SGOT: 21 (07/26/2010)   SGPT: 21 (07/26/2010)   HDL:66.50 (07/26/2010), 53.80 (08/11/2009)  LDL:100 (07/26/2010), 80 (16/02/9603)  Chol:191 (07/26/2010), 147 (08/11/2009)  Trig:121.0 (07/26/2010), 66.0 (08/11/2009)  Very good control on present medications - continue the same.  Problem # 4:  OBSTRUCTIVE SLEEP APNEA (ICD-327.23) He is using CPAP and seems to have been doing well. See below  Problem # 5:  SLEEP DISORDER (ICD-780.50) Patient is c/o sleep duration insomnia for the past several months. Reviewed the principles of sleep hygiene: regular sleep schedule, no stimulants, regular exercise at least 4 hours prior to  bedtime, sleep sanctuary, avoidance of extinction behaviors.  Plan - he will work on extinction behaviors, i.e. will get out of bed if insomnia occurs           use of non-benzodiazepine hypnotic as needed, e.g. sonata.  Problem # 6:  Preventive Health Care (ICD-V70.0) History and exam worrisome for left LE Radiculopathy. HIs exam is otherwise normal. Lab results are excellent. 12 Lead EKG negative for signs of ischemia. No record of colonoscopy on file - will need to consider study for routine colorectal cancer screening. Immunizations: current for tetnus; due for pneumonia vaccine; a candidate for shingles vaccine.  In summary - a very nice man who seems to be medically stable except for radiculopathy. Will request urgent NS consultation.  Complete Medication List: 1)  Lovastatin 40 Mg Tabs (Lovastatin) .... Take 1 tablet by mouth once a day 2)  Cpap +10, Pillows, Humidifier  .... As directed 3)  Viagra 100 Mg Tabs (Sildenafil citrate) .... Take as needed. 4)  Hemorrhoidal-hc 25 Mg Supp (Hydrocortisone acetate) .Marland Kitchen.. 1 pr two times a day as needed 5)  Prednisone 10 Mg Tabs (Prednisone) .... 3 tabs once daily x 3, 2 tabs once daily x 3, 1 tab q1d x6 for sciatica 6)  Zaleplon 10 Mg Caps (Zaleplon) .Marland Kitchen.. 1 by mouth at bedtime as needed  Other Orders: EKG w/ Interpretation (93000)  Patient: Russell Drake Note: All result statuses are Final unless otherwise noted.  Tests: (1) CBC Platelet w/Diff (CBCD)   White Cell Count          5.1 K/uL                    4.5-10.5   Red Cell Count            4.38 Mil/uL                 4.22-5.81   Hemoglobin                14.6 g/dL                   54.0-98.1   Hematocrit                42.5 %                      39.0-52.0   MCV                       97.1 fl  78.0-100.0   MCHC                      34.4 g/dL                   04.5-40.9   RDW                       12.8 %                      11.5-14.6   Platelet Count       [L]   139.0 K/uL                  150.0-400.0   Neutrophil %              59.2 %                      43.0-77.0   Lymphocyte %              27.9 %                      12.0-46.0   Monocyte %                10.1 %                      3.0-12.0   Eosinophils%              2.5 %                       0.0-5.0   Basophils %               0.3 %                       0.0-3.0   Neutrophill Absolute      3.0 K/uL                    1.4-7.7   Lymphocyte Absolute       1.4 K/uL                    0.7-4.0   Monocyte Absolute         0.5 K/uL                    0.1-1.0  Eosinophils, Absolute                             0.1 K/uL                    0.0-0.7   Basophils Absolute        0.0 K/uL                    0.0-0.1  Tests: (2) Lipid Panel (LIPID)   Cholesterol               191 mg/dL                   8-119     ATP III Classification            Desirable:  < 200 mg/dL  Borderline High:  200 - 239 mg/dL               High:  > = 240 mg/dL   Triglycerides             121.0 mg/dL                 1.6-109.6     Normal:  <150 mg/dL     Borderline High:  045 - 199 mg/dL   HDL                       40.98 mg/dL                 >11.91   VLDL Cholesterol          24.2 mg/dL                  4.7-82.9   LDL Cholesterol      [H]  562 mg/dL                   1-30  CHO/HDL Ratio:  CHD Risk                             3                    Men          Women     1/2 Average Risk     3.4          3.3     Average Risk          5.0          4.4     2X Average Risk          9.6          7.1     3X Average Risk          15.0          11.0                           Tests: (3) BMP (METABOL)   Sodium                    140 mEq/L                   135-145   Potassium                 4.3 mEq/L                   3.5-5.1   Chloride                  103 mEq/L                   96-112   Carbon Dioxide            29 mEq/L                    19-32   Glucose                   81 mg/dL                    86-57    BUN  13 mg/dL                    5-63   Creatinine                1.2 mg/dL                   8.7-5.6   Calcium                   9.7 mg/dL                   4.3-32.9   GFR                       77.16 mL/min                >60.00  Tests: (4) Hepatic/Liver Function Panel (HEPATIC)   Total Bilirubin           1.0 mg/dL                   5.1-8.8   Direct Bilirubin          0.2 mg/dL                   4.1-6.6   Alkaline Phosphatase      47 U/L                      39-117   AST                       21 U/L                      0-37   ALT                       21 U/L                      0-53   Total Protein             7.2 g/dL                    0.6-3.0   Albumin                   4.6 g/dL                    1.6-0.1  Tests: (5) TSH (TSH)   FastTSH                   0.95 uIU/mL                 0.35-5.50  Tests: (6) Prostate Specific Antigen (PSA)   PSA-Hyb                   1.07 ng/mL                  0.10-4.00  Tests: (7) UDip w/Micro (URINE)   Color                     LT. YELLOW       RANGE:  Yellow;Lt. Yellow   Clarity                   CLEAR  Clear   Specific Gravity          1.020                       1.000 - 1.030   Urine Ph                  6.5                         5.0-8.0   Protein                   NEGATIVE                    Negative   Urine Glucose             NEGATIVE                    Negative   Ketones                   NEGATIVE                    Negative   Urine Bilirubin           NEGATIVE                    Negative   Blood                     NEGATIVE                    Negative   Urobilinogen              0.2                         0.0 - 1.0   Leukocyte Esterace        TRACE                       Negative   Nitrite                   NEGATIVE                    Negative   Urine WBC                 0-2/hpf                     0-2/hpf   Urine Epith               Rare(0-4/hpf)               Rare(0-4/hpf)   Urine Bacteria             Rare(<10/hpf)               NonePrescriptions: PREDNISONE 10 MG TABS (PREDNISONE) 3 tabs once daily x 3, 2 tabs once daily x 3, 1 tab q1d x6 for sciatica  #21 x 0   Entered and Authorized by:   Jacques Navy MD   Signed by:   Jacques Navy MD on 08/02/2010   Method used:   [facility]        [facility]  [address] [id number]       [address]  [place], [place]  [address]       Ph: [phone] or [phone]       Fax: [phone]   RxID:   [id number] VIAGRA 100 MG TABS (SILDENAFIL CITRATE) take as needed.  #18 x 3   Entered and Authorized by:   Jacques Navy MD   Signed by:   Jacques Navy MD on 08/02/2010   Method used:   [facility]        [facility]  [address] [id number]       [address]       [place], [place]  [address]       Ph: [phone] or [phone]       Fax: [phone]   RxID:   [id number] LOVASTATIN 40 MG TABS (LOVASTATIN) Take 1 tablet by mouth once a day  #90 x 3   Entered and Authorized by:   Jacques Navy MD   Signed by:   Jacques Navy MD on 08/02/2010   Method used:   [facility]        [facility]  [address] [id number]       [address]       [place], [place]  [address]       Ph: [phone] or [phone]       Fax: [phone]   RxID:   [id number]    Orders Added: 1)  Neurosurgeon Referral [Neurosurgeon] 2)  Est. Patient 40-64 years [99396] 3)  Est. Patient Level IV [16967] 4)  EKG w/ Interpretation [93000]

## 2010-08-16 NOTE — Progress Notes (Signed)
Summary: Rx inquiry  Phone Note Call from Patient Call back at Home Phone [phone]   Caller: Patient Summary of Call: *Pt states that when he was in office for CPE yesterday that he was to have a 90-day supply of Hydrocodone-APAP and Zaleplon called in to pharmacy.  **I do see the Zaleplon in chart note, but not the Vicodin and there is still the 90-day supply in question.? **Please Advise. Initial call taken by: Burnard Leigh [facility]),  August 03, 2010 5:13 PM  Follow-up for Phone Call        delayed the hydrocodone due to the sterid taper. It is ok to send in an Rx for a 90 day supply if he is still having pain. Follow-up by: Jacques Navy MD,  August 03, 2010 5:52 PM  Additional Follow-up for Phone Call Additional follow up Details #1::        Please advise details of hydrocodone. OK for sleep med refill also?  - Pt states that prednisone has not helped with pain.  Additional Follow-up by: Lamar Sprinkles, CMA,  August 06, 2010 5:45 PM    Additional Follow-up for Phone Call Additional follow up Details #2::    hydrocodone /apap 5/325 1 by mouth q 6 as needed # 60, 1 refill.  OK to refill sonata 10mg  # 30 1 by mouth at bedtime as needed  Follow-up by: Jacques Navy MD,  August 06, 2010 5:48 PM  New/Updated Medications: HYDROCODONE-ACETAMINOPHEN 5-325 MG TABS (HYDROCODONE-ACETAMINOPHEN) 1 tablet every 6 hours as needed for pain SONATA 10 MG CAPS (ZALEPLON) 1 at bedtime as needed Prescriptions: SONATA 10 MG CAPS (ZALEPLON) 1 at bedtime as needed  #30 x 1   Entered by:   Ami Bullins CMA   Authorized by:   Jacques Navy MD   Signed by:   Bill Salinas CMA on 08/06/2010   Method used:   [facility]ph[facility] to ...       [facility]  [address] [id number]       [address]       [place], [place]  [address]       Ph: [phone] or [phone]       Fax: [phone]   RxID:   [id number] HYDROCODONE-ACETAMINOPHEN 5-325 MG TABS  (HYDROCODONE-ACETAMINOPHEN) 1 tablet every 6 hours as needed for pain  #60 x 1   Entered by:   Bill Salinas CMA   Authorized by:   Jacques Navy MD   Signed by:   Bill Salinas CMA on 08/06/2010   Method used:   Telephoned to ...       [facility]  [address] [id number]       [address]       [place], [place]  [address]       Ph: [phone] or [phone]       Fax: [phone]   RxID:   [id number]

## 2010-08-29 ENCOUNTER — Other Ambulatory Visit: Payer: Self-pay | Admitting: Internal Medicine

## 2010-09-24 ENCOUNTER — Other Ambulatory Visit: Payer: Self-pay | Admitting: *Deleted

## 2010-09-24 ENCOUNTER — Telehealth: Payer: Self-pay | Admitting: *Deleted

## 2010-09-25 ENCOUNTER — Telehealth: Payer: Self-pay | Admitting: *Deleted

## 2010-09-25 NOTE — Telephone Encounter (Signed)
Ok to refill x 3 

## 2010-09-25 NOTE — Telephone Encounter (Signed)
[facility] [address] [phone]. Hydrocodone 5-325 qty #60 SIG take 1 tablet every 6 hours as needed for pain. Please Advise

## 2010-09-26 MED ORDER — HYDROCODONE-ACETAMINOPHEN 5-325 MG PO TABS: 1.0000 | ORAL_TABLET | Freq: Four times a day (QID) | ORAL | Status: DC | PRN

## 2010-09-26 NOTE — Telephone Encounter (Signed)
Med called in for pt

## 2010-10-02 NOTE — Procedures (Signed)
Russell Drake, PICOTTE NO.:  [id number]   MEDICAL RECORD NO.:  [id number]          PATIENT TYPE:  OUT   LOCATION:  SLEEP CENTER                 FACILITY[facility]   PHYSICIAN:  Barbaraann Share, MD,FCCPDATE OF BIRTH:  1946-05-03   DATE OF STUDY:  01/16/2007                            NOCTURNAL POLYSOMNOGRAM   REFERRING PHYSICIAN:  Rosalyn Gess. Norins, MD   INDICATION FOR STUDY:  Hypersomnia with sleep apnea.   EPWORTH SLEEPINESS SCORE:  Twelve.   MEDICATIONS:   SLEEP ARCHITECTURE:  The patient had a total sleep time of 388 minutes  with no slow wave sleep but adequate REM. Sleep onset latency was normal  as was REM onset. Sleep efficiency was fairly good at 95%.   RESPIRATORY DATA:  The patient was found to have 31 hypopneas, 8  obstructive apneas, as well as 3 central apneas for an apnea/hypopnea  index of 7 events per hour. The patient's events were not positional,  but they did occur much more commonly during REM. In fact, the REM  apnea/hypopnea index was 20 events per hour. There was moderate-to-loud  snoring noted throughout.   OXYGEN DATA:  Oxygen desaturation as low as 74% with the patient's  obstructive events.   CARDIAC DATA:  Occasional PVCs noted but no clinically significant  arrhythmias.   MOVEMENT-PARASOMNIA:  None.   IMPRESSIONS-RECOMMENDATIONS:  1. Mild obstructive sleep apnea/hypopnea syndrome with an      apnea/hypopnea index of 7 events per hour and 02 saturation as low      as 74% during REM. However, most of the patient's  events did occur      during REM, with an apnea/hypopnea index associated with REM of 20      per hour. This can often make the patient much more symptomatic      than what his overall numbers would indication. Clinical      correlation is suggested. Treatment for obstructive sleep apnea at      this degree can include weight loss alone if applicable, upper      airway surgery, oral      appliance and also CPAP.  2. Occasional premature ventricular contractions without clinically      significant arrhythmias.      Barbaraann Share, MD,FCCP  Diplomate, American Board of Sleep  Medicine  Electronically Signed     KMC/MEDQ  D:  01/28/2007 15:01:32  T:  01/29/2007 12:00:11  Job:  [id number]

## 2010-10-02 NOTE — Procedures (Signed)
NAME:  Russell Drake, Russell Drake            ACCOUNT NO.:  [id number]   MEDICAL RECORD NO.:  [id number]          PATIENT TYPE:  OUT   LOCATION:  SLEEP CENTER                 FACILITY:  [facility]   PHYSICIAN:  Oretha Milch, MD      DATE OF BIRTH:  1946-01-24   DATE OF STUDY:  08/06/2007                            NOCTURNAL POLYSOMNOGRAM   REFERRING PHYSICIAN:   INDICATIONS FOR THE STUDY:  Mild obstructive sleep apnea documented in  prior study August 2008 with worsening during REM sleep in this 65-year-  old gentleman with a BMI of 30, height 5 feet 8 inches, weight 198  pounds, neck size 17 inches, Epworth sleepiness score 13/24.   This CPAP titration study was performed with a sleep technologist in  attendance.  EEG, EOG, EMG EKG and respiratory data were recorded.  Sleep stages, arousals and respiratory parameters were staged according  to criteria laid out by the American Academy of Sleep Medicine.   SLEEP ARCHITECTURE:  Lights out was at 2224 p.m., lights on was a 0439  a.m.  Total sleep time was 304 minutes with a sleep period time of 358  minutes, leading to a first stage sleep efficiency of 81.1%.  Sleep  latency was 7.5 minutes with latency to REM sleep of 87.5 minutes.  Wake  after sleep onset was 63.5 minutes.  Sleep stages of the percentage of  total sleep time was N1 7.7%, N2 71.9%, N3 0% and REM sleep 20.4% (62  minutes).  Supine sleep accounted for 278.5 minutes and supine REM sleep  was noted for 43 minutes.  REM sleep was seen in five different cycles  with the longest period at 4:00 a.m.   AROUSAL DATA:  There were a total of 37 arousals of which 32 were  spontaneous and five were associated with respiratory events leading to  an arousal index of 7.3 events per hour.   RESPIRATORY DISTURBANCE DATA:  CPAP was initiated at 5 cm.  This was  titrated upwards due to snoring and respiratory events including some  upper airway resistance at higher levels of CPAP.  At a level of +10  cm,  he achieved 135 minutes of sleep including 26 minutes of REM supine  sleep.  One hypopnea was noted, and the lowest desaturation was 94%.   For the final level of 11 cm, he achieved 99.5 minutes of sleep with  29.5 minutes of REM sleep and 10.5 minutes of REM supine sleep.  Two  hypopneas were noted with the lowest desaturation of 93%.  Due to mask  leak with pillows at this level, he was changed to a nasal mask.   OXYGEN SATURATION DATA:  He had seven desaturations with an index of 1.4  events per hour.  The lowest desaturation was 93% during REM sleep.  He  spent 0 minutes with a saturation less than 88%.   PERIODIC LIMB MOVEMENT DATA:  No significant limb movements were  observed.   CARDIAC DATA:  Occasional PVCs were observed.  The lowest heart rate was  32 beats per minute during REM sleep and the highest heart rate was 158  beats per minute during REM  sleep.   DISCUSSION:  He was desensitized with a large swift nasal pillows prior  to CPAP hookup.  At high levels of CPAP, he developed leak and changed  to a mirage nasal medium nasal mask.   IMPRESSION:  Mild to moderate sleep disorder, mild to moderate  obstructive sleep apneas, obstructive sleep apnea, respiratory  disturbance was eliminated.  We had to use a CPAP at the level of 10-11  cm with no evidence of periodic limb movements, cardiac arrhythmias or  behavioral disturbance during sleep   RECOMMENDATIONS:  1. CPAP of plus 10 cm.  Initial CPAP of plus 10 cm with nasal pillows      (large)  2. Based on subjective symptoms and snoring, this can be increased to      plus 11 cm if necessary.  3. He should be cautioned against driving when sleepy and using      medications with sedative side effects.      Oretha Milch, MD  Electronically Signed     RVA/MEDQ  D:  08/12/2007 11:53:30  T:  08/12/2007 14:44:11  Job:  [id number]   cc:   Rosalyn Gess. Norins, MD  [address]  [place]  [place] [address]
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
## 2010-10-05 NOTE — Op Note (Signed)
Russell Drake, Russell Drake            ACCOUNT NO.:  [id number]   MEDICAL RECORD NO.:  [id number]          PATIENT TYPE:  AMB   LOCATION:  DSC                          FACILITY:  [facility]   PHYSICIAN:  Katy Fitch. Sypher, M.D. DATE OF BIRTH:  [birth year]   DATE OF PROCEDURE:  09/26/2006  DATE OF DISCHARGE:                               OPERATIVE REPORT   PREOPERATIVE DIAGNOSES:  1. 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  2. 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   POSTOPERATIVE DIAGNOSES:  1. 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  2. 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   OPERATION:  1. Release of right transverse carpal ligament.  2. Excision of a ganglion/myxoid cyst between the first and second      dorsal compartments of the right wrist, with release of the first      dorsal compartment.   OPERATING SURGEON:  Katy Fitch. Sypher, M.D.   ASSISTANT:  Jonni Sanger, P.A.   ANESTHESIA:  General by LMA.   SUPERVISING ANESTHESIOLOGIST:  Dr. Sampson Goon.   INDICATIONS FOR PROCEDURE:  Russell Drake is a 65 year old gentleman  retired from [employer] who is referred by Dr. Illene Regulus for  evaluation and management of numbness of the hands and a mass on the  radial aspect of the right wrist.   On examination in the office, Russell Drake was noted to have evidence of  bilateral carpal tunnel syndrome.  Preoperative electrodiagnostic  studies confirm bilateral carpal tunnel syndrome.  Also, during his  evaluation he reported a mass on the dorsal radial aspect of his right  wrist, consistent with a ganglion or myxoid cyst forming on the extensor  retinaculum.  He had clinical signs of first dorsal compartment  stenosis  with a positive Finkelstein's maneuver.   Between the time of his office visit and his interview in the  preoperative holding area, his cyst had dramatically increased in size.   After informed consent, he is brought to the operating room at this time  anticipating removal of his cyst, release of the first dorsal  compartment and release of his right transverse carpal ligament.   PROCEDURE:  Russell Drake is brought to the operating room and placed  in supine position on the operating table.  Following an anesthesia  consult by Dr. Sampson Goon, general anesthesia by LMA technique was  recommended and accepted by Russell Drake.   He was taken to room 2, placed in the supine position upon the operating  table and under Dr. Jarrett Ables supervision, general anesthesia by LMA  technique induced.   His right arm was prepped with Betadine soap solution and sterilely  draped.  A pneumatic tourniquet was applied to the proximal right  brachium.  Following exsanguination of the right arm with esmarch  bandage, the arterial tourniquet was  inflated to 220 mmHg.  The  procedure commenced with a short transverse incision directly over the  palpable mass.  Subcutaneous tissue was carefully divided revealing a  remarkable degree of inflammation of the subcutaneous tissues.  The  radial sensory branches were edematous and adherent to the extensor  retinaculum.  The radial sensory branches were elevated and gently  retracted with blunt retractors followed by isolation of the cyst.  The  cyst was not directly over the retinaculum of the first dorsal  compartment but was rather between the first and second dorsal  compartments.  This was more than 1.2 cm in diameter and had hemorrhagic  mucin within the cyst.  The cyst was circumferentially dissected with  scissors followed by use of a rongeur to debride all of the myxoid  material, including the entire dorsal capsular membrane.   The  first dorsal compartment was then entered and there was noted to be  one slip of the extensor pulsus brevis and one slip of the abductor  pulsus longus invested in inflammatory synovium.  A very minimal  membrane septum was resected between the tendons.   This wound was then repaired with intradermal 3-0 Prolene and a Steri-  Strip.  Attention was then directed to the right palm.  A short incision  was fashioned in the line of the ring finger, in the palm, at the distal  margin of the transverse carpal ligament.  Subcutaneous tissue was  carefully divided, taking care to identify the palmar fascia.  This was  split longitudinally to reveal the common sensory branch of the median  nerve.  These were followed back to the median nerve proper, which was  gently isolated at the medial nerve with a Penfield #4 Engineer, structural.  The  transverse carpal ligament and the volar forearm fascia were released  subcutaneously extending into the distal forearm.   This widely opened the carpal canal.  No masses or other predicaments  were noted.   Bleeding points along the margin of the released ligament were  electrocauterized with bipolar current followed by repair of the skin  with intradermal 3-0 Prolene suture.   A compressive dressing was applied with a volar plastic splint  maintaining the wrist in 5 degrees of dorsiflexion.      Katy Fitch Sypher, M.D.  Electronically Signed     RVS/MEDQ  D:  09/26/2006  T:  09/26/2006  Job:  [id number]   cc:   Rosalyn Gess. Norins, MD

## 2010-10-05 NOTE — Op Note (Signed)
[place]. [facility]  Patient:    Russell Drake, SCHROM Visit Number: [id number] MRN: [id number]          Service Type: Attending:  Ronaldo Miyamoto L. Franky Macho, M.D. Dictated by:   Mena Goes. Franky Macho, M.D. Proc. Date: 02/27/01                             Operative Report  PREOPERATIVE DIAGNOSES: 1. Displaced disk, left L5-S1. 2. Left S1 radiculopathy.  POSTOPERATIVE DIAGNOSES: 1. Displaced disk, left L5-S1. 2. Left S1 radiculopathy.  PROCEDURE:  Left L5-S1 semihemilaminectomy, diskectomy, with microdissection.  COMPLICATIONS:  None.  SURGEON:  Kyle L. Franky Macho, M.D.  ASSISTANT:  Tanya Nones. Jeral Fruit, M.D.  INDICATION:  [name] is a 65 year old gentleman with pain in his left lower extremity and back secondary to a displaced disk at L5-S1, which has not been relieved with conservative means.  I recommended and he agreed to undergo lumbar laminectomy and diskectomy.  DESCRIPTION OF PROCEDURE:  [name] was brought to the operating room, intubated, and placed under general anesthesia without difficulty.  He was rolled prone onto a Wilson frame and all pressure points were properly padded. His back was prepped, and I placed a spinal needle for localization.  X-ray showed that I was pointing to the spinous process of L4.  I removed the needle, then infiltrated my proposed incision with 9.5 cc of 0.5% lidocaine and 1:200,000 strength epinephrine.  The patient was then draped in a sterile fashion.  I made a skin incision with a #10 blade and took this down through the subcutaneous tissue to the thoracolumbar fascia sharply, controlled bleeding in the subcutaneous tissues with monopolar cautery.  I placed self-retaining Weitlaner retractor, then I proceeded to expose the laminae of L5 and of S1.  I placed a double-ended ganglion knife inferior to the lamina of L5.  I took an x-ray.  The x-ray showed that I was working at the correct interlaminar space.  Then  proceeded to use the high-speed air drill and perform a semihemilaminectomy of L5.  I removed the ligamentum flavum rostral to caudal and exposed the thecal sac.  I brought the microscope into the operative field.  Dr. Jeral Fruit assisted at this point in time, and together we were able to retract the thecal sac and expose the disk herniation.  I opened the disk space with a #15 blade.  I along with Dr. Jeral Fruit then removed the disk in a progressive fashion using curettes, pituitary rongeurs.  After adequate decompression was obtained, I inspected and Dr. Jeral Fruit inspected the nerve root.  I felt that there was more than adequate decompression of the nerve root, and I felt that there were no free fragments left behind.  I irrigated the wound.  I then closed the wound in layered fashion using Vicryl sutures to reapproximate the thoracolumbar fascia.  The subcutaneous tissues were reapproximated with Vicryl sutures.  Skin reapproximated with Steri-Strips.  A sterile dressing was then applied.  Patient was then rolled supine.  He was extubated moving all extremities postoperatively. Dictated by:   Mena Goes. Franky Macho, M.D. Attending:  Mena Goes. Franky Macho, M.D. DD:  02/27/01 TD:  02/28/01 Job: [id number]

## 2010-10-05 NOTE — H&P (Signed)
[place]. [facility]  Patient:    Russell, Drake Visit Number: [id number] MRN: [id number]          Service Type: DSU Location: 3000 [id number] Attending Physician:  Coletta Memos Dictated by:   Mena Goes. Franky Macho, M.D. Admit Date:  02/27/2001 Discharge Date: 02/28/2001                           History and Physical  ADMITTING DIAGNOSES: 1. Disk, left L5-S1. 2. Left S1 radiculopathy.  HISTORY OF PRESENT ILLNESS:  Russell Drake is a 65 year old gentleman who presented to my office December 03, 2000.  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  He has had pain in his back and left lower extremity for approximately a month prior to his presentation.  It started when he twisted to wave hello to someone.  The pain has only progressed.  He has been tried on a steroid taper which did not help.  He has tried other pain medications without success.  He had no bowel and bladder dysfunction, although some hesitancy when he starts his urinary stream.  He has tingling in his left lower extremity.  He is walking with a marked limp on the left side since then.  He has had epidural injections which also do not provide him with great relief.  He returned to work, thinking that he was good enough to go, but actually relented finally to the pain.  He came back in and at that point in time we discussed and he agreed to undergo lumbar laminectomy and diskectomy.  PAST MEDICAL HISTORY:  Hypercholesterolemia.  He had right wrist surgery in 1987.  ALLERGIES:  No known drug allergies.  FAMILY HISTORY:  Mother, age 63, is in good health.  Father, age 76s, in good health.  Hypertension present in the family history.  SOCIAL HISTORY:  He does not smoke.  He does drink socially.  He does not use illicit drugs.  He is 5 feet 8 inches tall and weighs 181 pounds.  REVIEW OF SYSTEMS:  Positive for eyeglasses, hypercholesterolemia, back and leg pain.  He denies constitutional,  ear, nose, throat, mouth, respiratory, gastrointestinal, genitourinary, skin, neurological, psychiatric, endocrine, hematologic, and allergic problems.  PHYSICAL EXAMINATION:  VITAL SIGNS:  Pulse 68.  GENERAL:  He is alert, oriented x 4, and answers all questions appropriately.  NEUROLOGIC:  Memory ________, attention span, and fund of knowledge are normal.  He is well kempt and in moderate distress.  He winces and is in obvious pain whenever he has to use his left lower extremity.  He can toe walk with difficulty on the left side.  He can do single toe raises on the right easily.  He has problems with single toe raises on the left side.  He has normal muscle tone, bulk, and coordination.  Positive straight leg raising on the left to 30 degrees.  None on the right.  Intact proprioception to pinprick, though pinprick diminished on the left L5 and S1 distribution.  Gait is antalgic and he favors the left lower extremity.  Toes are downgoing to plantar stimulation.  There is no clonus.  He has normal strength in the upper extremities.  There is no drift.  HEENT:  Pupils are equal, round, and reactive to light, and full extraocular movements along with normal visual field.  He had normal funduscopic exam. Facial sensation and movements are symmetric and normal.  Hearing intact  to finger rub bilaterally.  Uvula elevates in the midline.  Shoulder shrug is normal.  Tongue protrudes in the midline.  No cervical masses or bruits identified.  LUNGS:  Lung fields are clear.  HEART:  Regular rate and rhythm, no murmurs or rubs.  Pulses are good at the ribs and feet bilaterally.  LABORATORY:  MRI performed November 27, 2000 at [facility] shows a herniated disk at L5-S1 on the left side, compromising the left S1 nerve root and displacing the thecal sac.  Degenerated disk at L3-4, L4-5, L5-S1.  DIAGNOSES: 1. Displaced disk, left L5-S1. 2. Left S1 radiculopathy. 3. Degenerative disk disease,  lumbar spine.  PLAN:  [name] and I discussed options.  He essentially got through a complete trial of conservative treatment without relief.  I therefore recommended he agree to undergo lumbar laminectomy and diskectomy at L5-S1 for a left S1 radiculopathy.  He understands there is a 10-15% chance of recurrent disk herniation.  Bleeding, infection, no pain relief, bowel or bladder dysfunction, need for further surgery were also discussed, but he understands and wishes to proceed.Dictated by:   Mena Goes. Franky Macho, M.D. Attending Physician:  Coletta Memos DD:  02/27/01 TD:  02/27/01 Job: [id number]
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
## 2010-10-19 NOTE — Telephone Encounter (Signed)
refills  

## 2010-11-26 ENCOUNTER — Telehealth: Payer: Self-pay | Admitting: *Deleted

## 2010-11-26 NOTE — Telephone Encounter (Signed)
Refill request from [facility]. Zaleplon 10 mg SIG take one capsule at bedtime. QTY 30 last filled 08/31/2010, Please Advise refills

## 2010-11-26 NOTE — Telephone Encounter (Signed)
Ok for refill x 5 

## 2010-11-27 MED ORDER — ZALEPLON 5 MG PO CAPS: 5.0000 mg | ORAL_CAPSULE | Freq: Every day | ORAL | Status: AC

## 2010-12-14 ENCOUNTER — Telehealth: Payer: Self-pay | Admitting: *Deleted

## 2010-12-14 MED ORDER — HYDROCODONE-ACETAMINOPHEN 5-325 MG PO TABS: 1.0000 | ORAL_TABLET | Freq: Four times a day (QID) | ORAL | Status: AC | PRN

## 2010-12-14 NOTE — Telephone Encounter (Signed)
Called in.

## 2010-12-14 NOTE — Telephone Encounter (Signed)
Hydrocodone-Acetaminophen 5-325 mg [last refill 09/26/10 #60x3]

## 2010-12-14 NOTE — Telephone Encounter (Signed)
Ok for refill  x3 
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
## 2011-04-21 ENCOUNTER — Emergency Department (HOSPITAL_COMMUNITY)
Admission: EM | Admit: 2011-04-21 | Payer: 59 | Source: Home / Self Care | Attending: Emergency Medicine | Admitting: Emergency Medicine

## 2011-04-21 ENCOUNTER — Encounter: Payer: Self-pay | Admitting: Emergency Medicine

## 2011-04-21 NOTE — ED Notes (Signed)
Pt transported via EMS from [facility]. Per EMS pt has had multple falls recently, fell from [facility] today, hematoma to R forehead. Pt placed in KED d/t difficulty breathing when placed supine.

## 2011-05-15 ENCOUNTER — Encounter (HOSPITAL_COMMUNITY): Payer: Self-pay | Admitting: Emergency Medicine

## 2011-05-25 NOTE — ED Provider Notes (Signed)
History     CSN: [id number]  Arrival date & time      First MD Initiated Contact with Patient 04/21/11 1847      Chief Complaint  Patient presents with  . Fall   History of present illness status post fall from wheelchair today. Slight hematoma on right for head. No other injuries. Patient is overweight and restless on the spine.  level V caveat for urgent need for intervention.  No loss of consciousness, no deficits, neck pain. (Consider location/radiation/quality/duration/timing/severity/associated sxs/prior treatment) The history is limited by the absence of a caregiver.    History reviewed. No pertinent past medical history.  History reviewed. No pertinent past surgical history.  History reviewed. No pertinent family history.  History  Substance Use Topics  . Smoking status: Not on file  . Smokeless tobacco: Not on file  . Alcohol Use: Not on file      Review of Systems  Unable to perform ROS: Other    Allergies  Penicillins  Home Medications   Current Outpatient Rx  Name Route Sig Dispense Refill  . HYDROCODONE-ACETAMINOPHEN 5-325 MG PO TABS Oral Take 1 tablet by mouth every 6 (six) hours as needed for pain. 60 tablet 3  . LOVASTATIN 40 MG PO TABS  TAKE 1 TABLET BY MOUTH ONCE A DAY 30 tablet 11    BP 141/79  Pulse 82  Temp(Src) 97.1 F (36.2 C) (Oral)  Resp 14  Physical Exam  Nursing note and vitals reviewed. Constitutional: He is oriented to person, place, and time. He appears well-developed and well-nourished.        obese  HENT:  Head: Normocephalic and atraumatic.  Eyes: Conjunctivae and EOM are normal. Pupils are equal, round, and reactive to light.  Neck: Normal range of motion. Neck supple.  Cardiovascular: Normal rate and regular rhythm.   Pulmonary/Chest: Effort normal and breath sounds normal.  Abdominal: Soft. Bowel sounds are normal.  Musculoskeletal: Normal range of motion.  Neurological: He is alert and oriented to person, place,  and time.  Skin: Skin is warm and dry.  Psychiatric: He has a normal mood and affect.    ED Course  Procedures (including critical care time)  Labs Reviewed - No data to display No results found.   No diagnosis found.    MDM  Patient is at baseline. No x-rays necessary        Donnetta Hutching, MD 05/30/11 [phone]

## 2011-12-17 ENCOUNTER — Other Ambulatory Visit: Payer: Self-pay | Admitting: *Deleted

## 2011-12-17 NOTE — Telephone Encounter (Signed)
Requested refill on med from [facility]. Denied  Last OV 07/2010. Info to pharmacy to have patient make appt.

## 2012-01-01 ENCOUNTER — Other Ambulatory Visit: Payer: Self-pay | Admitting: *Deleted

## 2012-01-01 NOTE — Telephone Encounter (Signed)
[facility] of denied refill on medication zaleplon . Patient needs to make appt. For OV with Dr, Debby Bud

## 2013-07-08 ENCOUNTER — Encounter: Payer: Self-pay | Admitting: Gastroenterology

## 2014-02-07 ENCOUNTER — Encounter: Payer: Self-pay | Admitting: Gastroenterology

## 2015-06-15 ENCOUNTER — Ambulatory Visit (INDEPENDENT_AMBULATORY_CARE_PROVIDER_SITE_OTHER): Payer: Medicare Other | Admitting: Physician Assistant

## 2015-06-15 VITALS — BP 120/78 | HR 68 | Temp 98.3°F | Resp 20 | Ht 67.32 in | Wt 193.0 lb

## 2015-06-15 DIAGNOSIS — J019 Acute sinusitis, unspecified: Secondary | ICD-10-CM | POA: Diagnosis not present

## 2015-06-15 DIAGNOSIS — R0981 Nasal congestion: Secondary | ICD-10-CM

## 2015-06-15 DIAGNOSIS — R05 Cough: Secondary | ICD-10-CM

## 2015-06-15 DIAGNOSIS — R059 Cough, unspecified: Secondary | ICD-10-CM

## 2015-06-15 MED ORDER — GUAIFENESIN ER 1200 MG PO TB12: 1.0000 | ORAL_TABLET | Freq: Two times a day (BID) | ORAL | Status: DC | PRN

## 2015-06-15 MED ORDER — BENZONATATE 100 MG PO CAPS: 100.0000 mg | ORAL_CAPSULE | Freq: Three times a day (TID) | ORAL | Status: DC | PRN

## 2015-06-15 MED ORDER — AMOXICILLIN-POT CLAVULANATE 875-125 MG PO TABS: 1.0000 | ORAL_TABLET | Freq: Two times a day (BID) | ORAL | Status: AC

## 2015-06-15 NOTE — Progress Notes (Signed)
[facility] [address] [place] [address] [phone]  Date:  06/15/2015   Name:  Russell Drake   DOB:  [birth year]   MRN:  [id number]  PCP:  Illene Regulus, MD   Chief Complaint  Patient presents with  . Cough    yellow phlegm x 1 week   . Sinus Problem    chest pain      History of Present Illness:  Russell Drake is a 70 y.o. male patient who presents to [facility] for cc of nasal congestion and cough for 1 week. He has nasal congestion with a lot of mucus.  He has cough throughout the day that is not as significant at night though prevalent.  He denies sob or dyspnea.  He has a productive yellow phlegm.  He took otc allergy medication, claritin.  No fever.  No sob or dyspnea.   Hydrating well with water.  No sinus tenderness, but it does feel full.  No otalgia.  He feels that it has been intermittently more throughout the week.  One day he is feeling better.  The next day the symptoms restart without lessening of severity.    Patient Active Problem List   Diagnosis Date Noted  . LUMBAR RADICULOPATHY, LEFT 08/02/2010  . SLEEP DISORDER 08/02/2010  . 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 ERECTILE DYSFUNCTION 08/09/2008  . HEMORRHOIDS 08/09/2008  . DERANGEMENT OF MENISCUS NOT ELSEWHERE CLASSIFIED 08/09/2008  . FRACTURE, ANKLE, LEFT 08/09/2008  . HEADACHE 12/23/2007  . GEN OSTEOARTHROSIS INVOLVING MULTIPLE SITES 09/25/2007  . HYPERLIPIDEMIA 07/31/2007  . Disturbance of skin sensation 07/31/2007  . NEPHROLITHIASIS, HX OF 07/31/2007  . OBSTRUCTIVE SLEEP APNEA 03/19/2007    Past Medical History  Diagnosis Date  . Arthritis     Past Surgical History  Procedure Laterality Date  . Fracture surgery      Social History  Substance Use Topics  . Smoking status: Never Smoker   . Smokeless tobacco: None  . Alcohol Use: No    Family History  Problem Relation Age of Onset  . Hyperlipidemia Mother     Allergies  Allergen Reactions   . Penicillins     Medication list has been reviewed and updated.  Current Outpatient Prescriptions on File Prior to Visit  Medication Sig Dispense Refill  . lovastatin (MEVACOR) 40 MG tablet TAKE 1 TABLET BY MOUTH ONCE A DAY 30 tablet 11   No current facility-administered medications on file prior to visit.    ROS ROS otherwise unremarkable unless listed above.   Physical Examination: BP 120/78 mmHg  Pulse 68  Temp(Src) 98.3 F (36.8 C) (Oral)  Resp 20  Ht 5' 7.32" (1.71 m)  Wt 193 lb (87.544 kg)  BMI 29.94 kg/m2  SpO2 93% Ideal Body Weight: Weight in (lb) to have BMI = 25: 160.8  Physical Exam  Constitutional: He is oriented to person, place, and time. He appears well-developed and well-nourished. No distress.  HENT:  Head: Atraumatic.  Right Ear: Tympanic membrane, external ear and ear canal normal.  Left Ear: Tympanic membrane, external ear and ear canal normal.  Nose: Mucosal edema and rhinorrhea present. Right sinus exhibits maxillary sinus tenderness. Right sinus exhibits no frontal sinus tenderness. Left sinus exhibits maxillary sinus tenderness. Left sinus exhibits no frontal sinus tenderness.  Mouth/Throat: No uvula swelling. Posterior oropharyngeal erythema present. No oropharyngeal exudate or posterior oropharyngeal edema.  Eyes: Conjunctivae, EOM and lids are normal. Pupils are equal, round, and reactive to  light. Right eye exhibits normal extraocular motion. Left eye exhibits normal extraocular motion.  Neck: Trachea normal and full passive range of motion without pain. No edema and no erythema present.  Cardiovascular: Normal rate.   Pulmonary/Chest: Effort normal. No respiratory distress. He has no decreased breath sounds. He has no wheezes. He has no rhonchi.  Neurological: He is alert and oriented to person, place, and time.  Skin: Skin is warm and dry. He is not diaphoretic.  Psychiatric: He has a normal mood and affect. His behavior is normal.      Assessment and Plan: Russell Drake is a 70 y.o. male who is here today  Patient states that he is not allergic to penicillins.  He states that his medical records were confused with his deceased brother.   I will start him on augmentin at this time.  Advised cough medication with precautions.     Subacute sinusitis, unspecified location - Plan: amoxicillin-clavulanate (AUGMENTIN) 875-125 MG tablet, Guaifenesin (MUCINEX MAXIMUM STRENGTH) 1200 MG TB12  Cough - Plan: benzonatate (TESSALON) 100 MG capsule  Nasal congestion - Plan: amoxicillin-clavulanate (AUGMENTIN) 875-125 MG tablet, Guaifenesin (MUCINEX MAXIMUM STRENGTH) 1200 MG TB12  Trena Platt, PA-C [facility] 06/15/2015 4:33 PM

## 2015-06-15 NOTE — Patient Instructions (Signed)

## 2015-06-18 ENCOUNTER — Encounter: Payer: Self-pay | Admitting: Physician Assistant

## 2017-09-25 DIAGNOSIS — I10 Essential (primary) hypertension: Secondary | ICD-10-CM | POA: Diagnosis not present

## 2017-09-25 DIAGNOSIS — E785 Hyperlipidemia, unspecified: Secondary | ICD-10-CM | POA: Diagnosis not present

## 2017-09-25 DIAGNOSIS — M545 Low back pain: Secondary | ICD-10-CM | POA: Diagnosis not present

## 2017-09-25 DIAGNOSIS — E663 Overweight: Secondary | ICD-10-CM | POA: Diagnosis not present

## 2017-09-25 DIAGNOSIS — Z6826 Body mass index (BMI) 26.0-26.9, adult: Secondary | ICD-10-CM | POA: Diagnosis not present

## 2017-09-25 DIAGNOSIS — H269 Unspecified cataract: Secondary | ICD-10-CM | POA: Diagnosis not present

## 2017-09-25 DIAGNOSIS — E559 Vitamin D deficiency, unspecified: Secondary | ICD-10-CM | POA: Diagnosis not present

## 2018-10-27 DIAGNOSIS — G4733 Obstructive sleep apnea (adult) (pediatric): Secondary | ICD-10-CM | POA: Diagnosis not present

## 2018-10-27 DIAGNOSIS — E559 Vitamin D deficiency, unspecified: Secondary | ICD-10-CM | POA: Diagnosis not present

## 2018-10-27 DIAGNOSIS — Z809 Family history of malignant neoplasm, unspecified: Secondary | ICD-10-CM | POA: Diagnosis not present

## 2018-10-27 DIAGNOSIS — M545 Low back pain: Secondary | ICD-10-CM | POA: Diagnosis not present

## 2018-10-27 DIAGNOSIS — I10 Essential (primary) hypertension: Secondary | ICD-10-CM | POA: Diagnosis not present

## 2018-10-27 DIAGNOSIS — N400 Enlarged prostate without lower urinary tract symptoms: Secondary | ICD-10-CM | POA: Diagnosis not present

## 2018-10-27 DIAGNOSIS — E785 Hyperlipidemia, unspecified: Secondary | ICD-10-CM | POA: Diagnosis not present

## 2018-11-25 ENCOUNTER — Encounter (HOSPITAL_COMMUNITY): Payer: Self-pay | Admitting: Emergency Medicine

## 2018-11-25 ENCOUNTER — Other Ambulatory Visit: Payer: Self-pay

## 2018-11-25 ENCOUNTER — Emergency Department (HOSPITAL_COMMUNITY): Payer: Medicare PPO

## 2018-11-25 ENCOUNTER — Emergency Department (HOSPITAL_COMMUNITY)
Admission: EM | Admit: 2018-11-25 | Discharge: 2018-11-25 | Disposition: A | Discharge status: Home / Self Care | Payer: Medicare PPO | Attending: Emergency Medicine | Admitting: Emergency Medicine

## 2018-11-25 DIAGNOSIS — M7660 Achilles tendinitis, unspecified leg: Secondary | ICD-10-CM | POA: Diagnosis not present

## 2018-11-25 DIAGNOSIS — L853 Xerosis cutis: Secondary | ICD-10-CM | POA: Diagnosis not present

## 2018-11-25 DIAGNOSIS — I10 Essential (primary) hypertension: Secondary | ICD-10-CM | POA: Insufficient documentation

## 2018-11-25 DIAGNOSIS — M79671 Pain in right foot: Secondary | ICD-10-CM | POA: Diagnosis present

## 2018-11-25 DIAGNOSIS — L988 Other specified disorders of the skin and subcutaneous tissue: Secondary | ICD-10-CM | POA: Diagnosis not present

## 2018-11-25 DIAGNOSIS — M6780 Other specified disorders of synovium and tendon, unspecified site: Secondary | ICD-10-CM | POA: Diagnosis not present

## 2018-11-25 DIAGNOSIS — Z79899 Other long term (current) drug therapy: Secondary | ICD-10-CM | POA: Insufficient documentation

## 2018-11-25 DIAGNOSIS — M79672 Pain in left foot: Secondary | ICD-10-CM | POA: Diagnosis not present

## 2018-11-25 DIAGNOSIS — M25572 Pain in left ankle and joints of left foot: Secondary | ICD-10-CM | POA: Diagnosis not present

## 2018-11-25 NOTE — Discharge Instructions (Addendum)
Please read instructions below. Apply ice to your left foot for 20 minutes at a time. Keep it elevated. You can take tylenol every 4-6 hours as needed for pain. Schedule an appointment with the orthopedic specialist in 1-2 weeks for follow-up on your injury. Apply an over-the-counter product such as aquaphor to your right foot to help keep the skin from cracking. Follow up with your primary care. Return to the ER for new or concerning symptoms.

## 2018-11-25 NOTE — ED Notes (Signed)
Ortho tech called to fit pt in Cam boot as ordered.  Pt to be discharged after applied
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
## 2018-11-25 NOTE — ED Triage Notes (Signed)
pt reports chronic dry heel on the bottom of his right foot that is cracking and also 3 days ago he began to have soreness in left achillis tendon. No known injury but denies any wound.

## 2018-11-25 NOTE — Progress Notes (Signed)
Orthopedic Tech Progress Note Patient Details:  Russell Drake [birth year] [id number]  Ortho Devices Type of Ortho Device: CAM walker Ortho Device/Splint Location: LLE Ortho Device/Splint Interventions: Ordered, Application   Post Interventions Patient Tolerated: Well Instructions Provided: Care of device   Braulio Bosch 11/25/2018, 1:32 PM

## 2019-04-23 DIAGNOSIS — Z03818 Encounter for observation for suspected exposure to other biological agents ruled out: Secondary | ICD-10-CM | POA: Diagnosis not present

## 2019-05-20 ENCOUNTER — Other Ambulatory Visit: Payer: Self-pay

## 2019-05-20 DIAGNOSIS — Z20822 Contact with and (suspected) exposure to covid-19: Secondary | ICD-10-CM

## 2019-05-20 DIAGNOSIS — Z20828 Contact with and (suspected) exposure to other viral communicable diseases: Secondary | ICD-10-CM | POA: Diagnosis not present

## 2019-05-22 LAB — NOVEL CORONAVIRUS, NAA: SARS-CoV-2, NAA: NOT DETECTED

## 2019-06-18 ENCOUNTER — Ambulatory Visit: Payer: Medicare PPO

## 2019-06-24 ENCOUNTER — Ambulatory Visit: Payer: Medicare PPO | Attending: Internal Medicine

## 2019-06-24 DIAGNOSIS — Z23 Encounter for immunization: Secondary | ICD-10-CM | POA: Insufficient documentation

## 2019-06-24 NOTE — Progress Notes (Signed)
   Covid-19 Vaccination Clinic  Name:  Russell Drake    MRN: [id number] DOB: 10-02-1945  06/24/2019  [name] was observed post Covid-19 immunization for 15 minutes without incidence. He was provided with Vaccine Information Sheet and instruction to access the V-Safe system.   [name] was instructed to call 911 with any severe reactions post vaccine: Marland Kitchen Difficulty breathing  . Swelling of your face and throat  . A fast heartbeat  . A bad rash all over your body  . Dizziness and weakness    Immunizations Administered    Name Date Dose VIS Date Route   Pfizer COVID-19 Vaccine 06/24/2019  8:39 AM 0.3 mL 04/30/2019 Intramuscular   Manufacturer: [employer], [employer]   Lot: [id number]   NDC: [address]-1

## 2019-07-19 ENCOUNTER — Ambulatory Visit: Payer: Medicare PPO | Attending: Internal Medicine

## 2019-07-19 DIAGNOSIS — Z23 Encounter for immunization: Secondary | ICD-10-CM | POA: Insufficient documentation

## 2019-07-19 NOTE — Progress Notes (Signed)
   Covid-19 Vaccination Clinic  Name:  FELIS QUILLIN    MRN: [id number] DOB: [birth year]  07/19/2019  Mr. Kitagawa was observed post Covid-19 immunization for 15 minutes without incidence. He was provided with Vaccine Information Sheet and instruction to access the V-Safe system.   Mr. Court was instructed to call 911 with any severe reactions post vaccine: Marland Kitchen Difficulty breathing  . Swelling of your face and throat  . A fast heartbeat  . A bad rash all over your body  . Dizziness and weakness    Immunizations Administered    Name Date Dose VIS Date Route   Pfizer COVID-19 Vaccine 07/19/2019 12:56 PM 0.3 mL 04/30/2019 Intramuscular   Manufacturer: [employer], [employer]   Lot: [id number]   NDC: [address]-3

## 2019-07-30 DIAGNOSIS — M159 Polyosteoarthritis, unspecified: Secondary | ICD-10-CM | POA: Diagnosis not present

## 2019-07-30 DIAGNOSIS — M479 Spondylosis, unspecified: Secondary | ICD-10-CM | POA: Diagnosis not present

## 2019-07-30 DIAGNOSIS — H269 Unspecified cataract: Secondary | ICD-10-CM | POA: Diagnosis not present

## 2019-07-30 DIAGNOSIS — N400 Enlarged prostate without lower urinary tract symptoms: Secondary | ICD-10-CM | POA: Diagnosis not present

## 2019-07-30 DIAGNOSIS — E785 Hyperlipidemia, unspecified: Secondary | ICD-10-CM | POA: Diagnosis not present

## 2019-07-30 DIAGNOSIS — L853 Xerosis cutis: Secondary | ICD-10-CM | POA: Diagnosis not present

## 2019-07-30 DIAGNOSIS — E559 Vitamin D deficiency, unspecified: Secondary | ICD-10-CM | POA: Diagnosis not present

## 2019-07-30 DIAGNOSIS — M545 Low back pain: Secondary | ICD-10-CM | POA: Diagnosis not present

## 2019-07-30 DIAGNOSIS — I10 Essential (primary) hypertension: Secondary | ICD-10-CM | POA: Diagnosis not present

## 2020-01-11 ENCOUNTER — Other Ambulatory Visit: Payer: Self-pay

## 2020-01-11 ENCOUNTER — Other Ambulatory Visit: Payer: Medicare PPO

## 2020-01-11 DIAGNOSIS — Z20822 Contact with and (suspected) exposure to covid-19: Secondary | ICD-10-CM

## 2020-01-12 LAB — SARS-COV-2, NAA 2 DAY TAT

## 2020-01-12 LAB — NOVEL CORONAVIRUS, NAA: SARS-CoV-2, NAA: NOT DETECTED

## 2020-02-08 ENCOUNTER — Ambulatory Visit: Payer: Self-pay | Attending: Internal Medicine

## 2020-02-08 DIAGNOSIS — Z23 Encounter for immunization: Secondary | ICD-10-CM

## 2020-02-08 NOTE — Progress Notes (Signed)
   Covid-19 Vaccination [facility]  Name:  REDDING CLOE    MRN: [id number] DOB: 04-Nov-1945  02/08/2020  Mr. Zimmers was observed post Covid-19 immunization for 15 minutes without incident. He was provided with Vaccine Information Sheet and instruction to access the V-Safe system.   Mr. Witherspoon was instructed to call 911 with any severe reactions post vaccine: Marland Kitchen Difficulty breathing  . Swelling of face and throat  . A fast heartbeat  . A bad rash all over body  . Dizziness and weakness

## 2020-05-30 DIAGNOSIS — Z03818 Encounter for observation for suspected exposure to other biological agents ruled out: Secondary | ICD-10-CM | POA: Diagnosis not present

## 2021-03-02 ENCOUNTER — Encounter: Payer: Self-pay | Admitting: Physical Medicine and Rehabilitation

## 2021-05-30 ENCOUNTER — Ambulatory Visit: Payer: No Typology Code available for payment source | Admitting: Physical Medicine and Rehabilitation

## 2021-06-25 ENCOUNTER — Encounter: Payer: Self-pay | Admitting: Physical Medicine and Rehabilitation

## 2021-06-25 ENCOUNTER — Other Ambulatory Visit: Payer: Self-pay

## 2021-06-25 ENCOUNTER — Encounter
Payer: No Typology Code available for payment source | Attending: Physical Medicine and Rehabilitation | Admitting: Physical Medicine and Rehabilitation

## 2021-06-25 VITALS — BP 153/89 | HR 73 | Temp 98.6°F | Ht 68.0 in | Wt 191.2 lb

## 2021-06-25 DIAGNOSIS — M5442 Lumbago with sciatica, left side: Secondary | ICD-10-CM | POA: Diagnosis present

## 2021-06-25 DIAGNOSIS — G8929 Other chronic pain: Secondary | ICD-10-CM | POA: Insufficient documentation

## 2021-06-25 DIAGNOSIS — M25561 Pain in right knee: Secondary | ICD-10-CM | POA: Diagnosis present

## 2021-06-25 DIAGNOSIS — M25562 Pain in left knee: Secondary | ICD-10-CM | POA: Insufficient documentation

## 2021-06-25 DIAGNOSIS — M5416 Radiculopathy, lumbar region: Secondary | ICD-10-CM | POA: Diagnosis present

## 2021-06-25 MED ORDER — DULOXETINE HCL 30 MG PO CPEP: 30.0000 mg | ORAL_CAPSULE | Freq: Every day | ORAL | 5 refills | Status: DC

## 2021-06-25 NOTE — Patient Instructions (Signed)
Pt is a 76 yr old R handed male with hx of HTN , HLD, and lumbar laminectomy with persistent LLE radiculopathy Back surgery 2001- doesn't know levels done.  L S1 radiculopathy s/p L L5/S1 diskectomy and hemilaminectomy with microdissection.  Not interested in opiates  2.  Will get pt back for B/L knee injections- steroids- on wait list.    3.   Duloxetine /Cymbalta 30 mg daily/nightly x 1 week  Then 60 mg daily/nightly- for nerve pain  1% of patients can have nausea with Duloxetine- call me if needs an anti-nausea medicine. Can also cause mild dry mouth/dry eyes and mild constipation. Can take longer to orgasm.    4. Give me a call in 1 month- and we will assess if need epidural steroid injections in back or if Cymbalta is working.   5. Can also try different nerve pain meds- never had kidney issues.    6. Use stim unit a little more frequently.   7.  [facility] with variable resistance- use on days doesn't walk.    8. Exercise 5 days/week is essential to get better pain control.   9. F/U in 3 months, but call in 1 month.
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
## 2021-09-26 ENCOUNTER — Encounter: Payer: Self-pay | Admitting: Physical Medicine and Rehabilitation

## 2021-09-26 ENCOUNTER — Encounter
Payer: No Typology Code available for payment source | Attending: Physical Medicine and Rehabilitation | Admitting: Physical Medicine and Rehabilitation

## 2021-09-26 VITALS — BP 142/82 | HR 45 | Ht 68.0 in | Wt 188.6 lb

## 2021-09-26 DIAGNOSIS — M25561 Pain in right knee: Secondary | ICD-10-CM | POA: Diagnosis present

## 2021-09-26 DIAGNOSIS — G8929 Other chronic pain: Secondary | ICD-10-CM | POA: Insufficient documentation

## 2021-09-26 DIAGNOSIS — M25562 Pain in left knee: Secondary | ICD-10-CM | POA: Insufficient documentation

## 2021-09-26 NOTE — Patient Instructions (Signed)
Pt is a 76 yr old R handed male with hx of HTN , HLD, and lumbar laminectomy with persistent LLE radiculopathy ?Back surgery 2001- doesn't know levels done.  ?L S1 radiculopathy s/p L L5/S1 diskectomy and hemilaminectomy with microdissection. 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  ? ? ?steroid injection was performed at knee steroid injection B/L using 1% plain Lidocaine and 40mg  /1cc of Kenalog. This was well tolerated. ? ?Cleaned with betadine x3 and allowed to dry- then alcohol then injected using 27 gauge 1.5 inch needle- no bleeding or complications.  ? ? ?F/U in 3 months for steroid injections of B/L knee steroid injections.  ?Lidocaine will kick in 15 minutes- and wear off tonight- the steroid will kick in tomorrow within 24 hours and take up to 72 hours to fully kick in. ? ? ?2. Off Duloxetine- had bad withdrawal- put on allergy list.  ? ? ?3. Getting decent pain control at [facility] with chiropractor-  ? ? ?4. Has to be 3 months- until next appt-  ?F/u 3 months if wants knee injections again.  ?

## 2021-09-26 NOTE — Progress Notes (Signed)
? ?Subjective:  ? ? Patient ID: Russell Drake, male    DOB: 02-03-1946, 76 y.o.   MRN: [id number] ? ?HPI ? ?Pt is a 76 yr old R handed male with hx of HTN , HLD, and lumbar laminectomy with persistent LLE radiculopathy ?Back surgery 2001- doesn't know levels done.  ?L S1 radiculopathy s/p L L5/S1 diskectomy and hemilaminectomy with microdissection. 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  ? ? ?Chiropractor is going well in neck and in back pain.  ?Still has dull constant pain.  ?Seeing 2x/week- [facility]  ? ?Duloxetine didn't help pain at all when took it.  ?And went through withdrawal -something crawling on skin and aching in joints- when [facility]'t [facility] when [facility]- so appropriate to not use again in his case. ? ?Last knee steroid injections B/L- over 6 months ago- hasn't had since COVID.  ?  ?Pain Inventory ?Average Pain 6 ?Pain Right Now 6 ?My pain is constant and dull ? ?In the last 24 hours, has pain interfered with the following? ?General activity 5 ?Relation with others 5 ?Enjoyment of life 5 ?What TIME of day is your pain at its worst? daytime ?Sleep (in general) Good ? ?Pain is worse with: standing ?Pain improves with: pacing activities and TENS ?Relief from Meds: 0 ? ?Family History  ?Problem Relation Age of Onset  ? Hyperlipidemia Mother   ? ?Social History  ? ?Socioeconomic History  ? Marital status: Married  ?  Spouse name: Not on file  ? Number of children: Not on file  ? Years of education: Not on file  ? Highest education level: Not on file  ?Occupational History  ? Not on file  ?Tobacco Use  ? Smoking status: Never  ? Smokeless tobacco: Not on file  ?Vaping Use  ? Vaping Use: Never used  ?Substance and Sexual Activity  ? Alcohol use: No  ?  Alcohol/week: 0.0 standard drinks  ? Drug use: No  ? Sexual activity: Not on file  ?Other Topics Concern  ? Not on file  ?Social History  Narrative  ? Not on file  ? ?Social Determinants of Health  ? ?Financial Resource Strain: Not on file  ?Food Insecurity: Not on file  ?Transportation Needs: Not on file  ?Physical Activity: Not on file  ?Stress: Not on file  ?Social Connections: Not on file  ? ?Past Surgical History:  ?Procedure Laterality Date  ? BACK SURGERY    ? FRACTURE SURGERY    ? ?Past Surgical History:  ?Procedure Laterality Date  ? BACK SURGERY    ? FRACTURE SURGERY    ? ?Past Medical History:  ?Diagnosis Date  ? Arthritis   ? Hyperlipidemia   ? Hypertension   ? ?BP (!) 142/82   Pulse (!) 45   Ht 5\' 8"  (1.727 m)   Wt 188 lb 9.6 oz (85.5 kg)   SpO2 97%   BMI 28.68 kg/m?  ? ?Opioid Risk Score:   ?Fall Risk Score:  `1 ? ?Depression screen PHQ 2/9 ? ? ?  09/26/2021  ?  9:50 AM 06/25/2021  ?  9:38 AM 06/15/2015  ?  4:24 PM  ?Depression screen PHQ 2/9  ?Decreased Interest 0 0 0  ?Down, Depressed, Hopeless 0 0 0  ?PHQ - 2 Score 0 0 0  ?Altered sleeping  0   ?Tired, decreased energy  0   ?Change  in appetite  0   ?Feeling bad or failure about yourself   1   ?Trouble concentrating  0   ?Moving slowly or fidgety/restless  0   ?Suicidal thoughts  0   ?PHQ-9 Score  1   ?Difficult doing work/chores  Not difficult at all   ?  ? ?Review of Systems  ?Constitutional: Negative.   ?HENT: Negative.    ?Eyes: Negative.   ?Respiratory: Negative.    ?Cardiovascular: Negative.   ?Gastrointestinal: Negative.   ?Endocrine: Negative.   ?Genitourinary: Negative.   ?Musculoskeletal:  Positive for arthralgias and back pain.  ?     Knees bilateral  ?Skin: Negative.   ?Allergic/Immunologic: Negative.   ?Neurological: Negative.   ?Hematological: Negative.   ?Psychiatric/Behavioral: Negative.    ?All other systems reviewed and are negative. ? ?   ?Objective:  ? Physical Exam ? ? ? ? ?   ?Assessment & Plan:  ? ?Pt is a 76 yr old R handed male with hx of HTN , HLD, and lumbar laminectomy with persistent LLE radiculopathy ?Back surgery 2001- doesn't know levels done.  ?L S1  radiculopathy s/p L L5/S1 diskectomy and hemilaminectomy with microdissection. 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  ? ? ?steroid injection was performed at knee steroid injection B/L using 1% plain Lidocaine and 40mg  /1cc of Kenalog. This was well tolerated. ? ?Cleaned with betadine x3 and allowed to dry- then alcohol then injected using 27 gauge 1.5 inch needle- no bleeding or complications.  ? ? ?F/U in 3 months for steroid injections of B/L knee steroid injections.  ?Lidocaine will kick in 15 minutes- and wear off tonight- the steroid will kick in tomorrow within 24 hours and take up to 72 hours to fully kick in. ? ? ?2. Off Duloxetine- had bad withdrawal- put on allergy list.  ? ? ?3. Getting decent pain control at [facility] with chiropractor-  ? ? ?4. Has to be 3 months- until next appt-  ?F/u 3 months if wants knee injections again.  ? ?

## 2021-12-21 ENCOUNTER — Encounter: Payer: No Typology Code available for payment source | Admitting: Physical Medicine and Rehabilitation

## 2022-04-05 ENCOUNTER — Encounter: Payer: No Typology Code available for payment source | Admitting: Physical Medicine and Rehabilitation
